# Patient Record
Sex: Female | Born: 1980 | Race: White | Hispanic: No | Marital: Married | Smoking: Never smoker
Health system: Southern US, Community
[De-identification: ages and names within clinical notes are randomized; demographics above are authoritative.]

## PROBLEM LIST (undated history)

## (undated) DIAGNOSIS — T7840XA Allergy, unspecified, initial encounter: Secondary | ICD-10-CM

## (undated) DIAGNOSIS — C801 Malignant (primary) neoplasm, unspecified: Secondary | ICD-10-CM

## (undated) DIAGNOSIS — F329 Major depressive disorder, single episode, unspecified: Secondary | ICD-10-CM

## (undated) DIAGNOSIS — F32A Depression, unspecified: Secondary | ICD-10-CM

## (undated) DIAGNOSIS — F419 Anxiety disorder, unspecified: Secondary | ICD-10-CM

## (undated) HISTORY — DX: Allergy, unspecified, initial encounter: T78.40XA

## (undated) HISTORY — PX: COSMETIC SURGERY: SHX468

## (undated) HISTORY — PX: DILATION AND CURETTAGE OF UTERUS: SHX78

## (undated) HISTORY — PX: BREAST ENHANCEMENT SURGERY: SHX7

## (undated) HISTORY — PX: BREAST SURGERY: SHX581

## (undated) HISTORY — PX: TUBAL LIGATION: SHX77

## (undated) HISTORY — PX: ABDOMINAL HYSTERECTOMY: SHX81

## (undated) HISTORY — DX: Malignant (primary) neoplasm, unspecified: C80.1

---

## 2007-07-10 ENCOUNTER — Emergency Department: Payer: Self-pay | Admitting: Emergency Medicine

## 2008-06-11 ENCOUNTER — Emergency Department: Payer: Self-pay | Admitting: Emergency Medicine

## 2008-06-19 ENCOUNTER — Emergency Department: Payer: Self-pay | Admitting: Emergency Medicine

## 2010-12-13 ENCOUNTER — Inpatient Hospital Stay: Payer: Self-pay

## 2011-08-04 ENCOUNTER — Ambulatory Visit: Payer: Self-pay | Admitting: Obstetrics and Gynecology

## 2013-01-05 ENCOUNTER — Observation Stay: Payer: Self-pay | Admitting: Obstetrics and Gynecology

## 2013-01-28 ENCOUNTER — Inpatient Hospital Stay: Payer: Self-pay | Admitting: Obstetrics and Gynecology

## 2013-01-28 LAB — CBC WITH DIFFERENTIAL/PLATELET
HCT: 37.1 % (ref 35.0–47.0)
HGB: 12.7 g/dL (ref 12.0–16.0)
Lymphocyte %: 11.3 %
MCH: 30.2 pg (ref 26.0–34.0)
MCHC: 34.2 g/dL (ref 32.0–36.0)
MCV: 88 fL (ref 80–100)
Monocyte #: 0.9 x10 3/mm (ref 0.2–0.9)
Platelet: 245 10*3/uL (ref 150–440)
RBC: 4.2 10*6/uL (ref 3.80–5.20)
RDW: 14.9 % — ABNORMAL HIGH (ref 11.5–14.5)
WBC: 18.1 10*3/uL — ABNORMAL HIGH (ref 3.6–11.0)

## 2013-01-29 LAB — HEMATOCRIT: HCT: 27.1 % — ABNORMAL LOW (ref 35.0–47.0)

## 2013-01-30 LAB — PATHOLOGY REPORT

## 2014-10-19 ENCOUNTER — Emergency Department: Payer: Self-pay | Admitting: Emergency Medicine

## 2014-12-12 NOTE — Op Note (Signed)
PATIENT NAME:  Madison [name], Madison [name] MR#:  [id number] DATE OF BIRTH:  1980-11-10  DATE OF PROCEDURE:  01/28/2013  PREOPERATIVE DIAGNOSIS: Prior cesarean section, fetal intolerance to labor.   POSTOPERATIVE DIAGNOSIS: Prior cesarean section, fetal intolerance to labor.   PROCEDURE: Low transverse C-section.   ANESTHESIA: General.  SURGEON: Donzetta Matters, M.D.   ASSISTANT: Dalia Heading, M.D.   ESTIMATED BLOOD LOSS: 800 Ml.   OPERATIVE FLUIDS: 1500 mL.   COMPLICATIONS: None.   FINDINGS: Vertex female infant, nuchal cord x 1, 4240 grams, Apgars 5 and 9. Normal uterus, tubes, and ovaries. Thin lower uterine segment.    SPECIMENS: Placenta and cord gas.   INDICATIONS: The patient is a 34 year old with a history of a prior cesarean section, who presents in labor for attempted vaginal birth after cesarean section. The patient presented at 7 cm. She has fetal tachycardia, repetitive late and repetitive variable decelerations. The decision was therefore made to deliver via repeat cesarean section. Risks, benefits, indications, and alternatives of the procedure were explained and informed consent was obtained.   PROCEDURE: The patient was taken to the operating room with IV fluids running. She was prepped and draped in the usual sterile fashion with a leftward tilt. She was placed under general anesthesia. A Pfannenstiel incision was made and carried down to the underlying fascia with the knife. The fascia was nicked in the midline. The incision was extended laterally. The superior aspect of the fascia was grasped with Kocher clamps and the underlying rectus muscles were dissected off. This was repeated on the inferior fascia. The rectus muscles were divided in the midline. The peritoneum was entered bluntly. The opening was extended. The bladder blade was placed. The vesicouterine peritoneum was grasped with the pick-ups and entered sharply with the Metzenbaum. The bladder flap was created  digitally. The Foley catheter had not been draining properly as the bladder was bulging into the surgical field upon evaluation by the nursing staff. The bladder then began to drain. The bladder blade was then replaced. The hysterotomy incision was made and carried down to underlying fetal parts. The opening was extended. The infant's head was grasped and delivered atraumatically through the hysterotomy incision. Nuchal cord x 1 was reduced. The anterior and posterior shoulders were delivered followed by the remainder of the body. The cord was clamped x 2 and cut and the infant was handed to the awaiting neonatologist.   There were some extensions of the hysterotomy incision down until the cervix and the hysterotomy incision in these areas were repaired with a #0 Monocryl in a running locked fashion. A second imbricating layer was placed. Surgical area was evaluated and found to be hemostatic. The uterus was returned to the abdomen. The abdomen and gutters were irrigated with copious amounts of warm normal saline. The peritoneum was closed with a 2-0 Vicryl. The On-Q pump apparatus was placed according to manufacturer's instructions. The fascia was closed with a #1 PDS. The subcutaneous layer was closed with a 2-0 Vicryl and the skin was closed with staples. The On-Q catheters were each bolused with 5 mL of 0.5% Sensorcaine. The area in the skin through the catheters were placed was sealed using Dermabond skin glue. The catheters were then affixed to the patient's abdomen using Steri-Strips and Tegaderm. The patient tolerated the procedure well. Sponge, needle and instrument counts were correct x 2. The patient was awakened from anesthesia and taken to the recovery room in stable condition.   ____________________________ Rolm Gala Ferne Reus, MD  law:aw D: 01/28/2013 18:40:22 ET T: 01/29/2013 06:07:53 ET JOB#: [id number]  cc: Sherlynn Carbon A. Ferne Reus, MD, <Dictator> Rolm Gala WEAVER LEE MD ELECTRONICALLY  SIGNED 02/01/2013 10:11

## 2014-12-30 NOTE — H&P (Signed)
L&D Evaluation:  History:  HPI 34 yo G5P2022 at [redacted] wks gestational age by EDC=01/28/2013 by LMP=04/23/2012 and confirmed with an 8 weeks ultrasound presents with c/o contractions since 1000 AM. Has had some spotting since membranes stripped last week. No LOF. Pregnancy has been complicated by history of prior cesarean section  with G2 for FITL  and successful ECV 3-4 weeks ago. Patient desires a VBAC and has been counseled on the risks of uterine rupture with TOLAC and has signed the [facility] TOLAC permit.LABS: O POS, RI, VI, GBS positive. Had TDAP on 12/04/12.   Presents with contractions   Patient's Medical History anxiety/depression   Patient's Surgical History D&C  Previous C-Section  breast augmentation   Medications Pre Natal Vitamins   Allergies NKDA   Social History none   Family History Non-Contributory   ROS:  ROS see HPI   Exam:  Vital Signs 119/76   General breathing thru contractions   Mental Status clear   Chest clear   Heart normal sinus rhythm, no murmur/gallop/rubs   Abdomen gravid, tender with contractions   Estimated Fetal Weight 8#   Fetal Position cephalic on US   Pelvic 4/26%/STMHD per RN exam   Mebranes Intact   FHT 170s baseline with variable decels with most contractions   Ucx q1 min aprt   Impression:  Impression IUP at 40 weeks with prior C-section in active labor. Fetal tachycardia   Plan:  Plan Admit. IV bolus. AROM and FSE. LAbs. Dr Ferne Reus notified of VBAC.   Electronic Signatures: Karene Fry (CNM)  (Signed 09-Jun-14 16:26)  Authored: L&D Evaluation   Last Updated: 09-Jun-14 16:26 by Karene Fry (CNM)

## 2014-12-30 NOTE — H&P (Signed)
L&D Evaluation:  History:  HPI 34 yo [id number] at [redacted]w[redacted]d gestational age by LMP consistent with 8 weeks ultrasound, pregnancy has been complicated by history of prior cesarean section  with G2 and malpresentation noted at 36 weeks visit. She notes positive fetal movement, denies leakage of fluid, vaginal bleeding, and contractions.  She presents today for external cephalic version.   Patient's Medical History anxiety/depression   Patient's Surgical History D&C  Previous C-Section  breast augmentation   Medications Pre Natal Vitamins   Allergies NKDA   Social History none   Family History Non-Contributory   ROS:  ROS All systems were reviewed.  HEENT, CNS, GI, GU, Respiratory, CV, Renal and Musculoskeletal systems were found to be normal.   Exam:  Vital Signs stable   General no apparent distress   Mental Status clear   Chest clear   Heart normal sinus rhythm   Abdomen gravid, non-tender   Estimated Fetal Weight Average for gestational age   Fetal Position transverse, head maternal left with spine down (caudad).   Back no CVAT   Edema no edema   Mebranes Intact   FHT normal rate with no decels   FHT Description 135/mod var/+accels/no decels   Ucx irregular, 3 q 10 min   Skin no lesions   Other Bedside ultrasound shows fetus in transverse lie with spine down, AFI = 19cm, placenta is anterior, well away from cervix.   Impression:  Impression malpresentation at [redacted]w[redacted]d gestational age with history of prior cesarean section   Plan:  Plan EFM/NST, fluids   Comments - I have had an exceedingly long and detailed discussion with the patient and her husband regarding the fetal lie.  She was seen by me in clinic 5 days ago.  At that time I performed a bedside ultrasound showing the fetus in breech presentation.  Today the fetus has rotated to the transverse presentation and the fetal head is on the opposite side.  We discussed the following management strategies: 1) do  nothing at this time and see if fetus will turn by himself to vertex.  This was discussed as a very reasonable option as it carried no procedural risks (discussed in detail) and she has a history of a breech presentation where the fetus spontaneously verted to cephalic at 39 weeks with G2.  The main downside to this approach would be that the fetus may not turn to cephalic and may go to breech or stay transverse.  2) Attempt cephalic version today with fairly high chance of success given the following factors that have been shown in some studies to predict success; parity (not her first baby), adequate amniotic fluid, transverse lie, fetus not applied to pelvis.  We discussed that factors not in her favor were that she did have an anterior placenta.  However, this has not been definitively shown to make a large difference.  We discussed the risks of the procedure including; placental abruption, rupture of membranes, fetal intolerance of the procedure, risk of failure of procedure, risk that fetus would return to a non-cephalic presentation even if the procedure is successful.  Any of these risks could necessitate an emergency cesarean section.  We discussed use of medications involved in the procedure (terbutaline) and its side effects and risks.  She and her husband agree to proceed with the procedure pending a reactive NST (which she has already demonstrated).  The OR, anesthesia, and appropriate nursing teams are aware that this procedure is being attempted.  Will minitor, regardless  of the outcome, for at least one hour post-procedure.   Electronic Signatures for Addendum Section:  Will Bonnet (MD) (Signed Addendum 17-May-14 14:16)  After some delay, due to need to have availability of OR staff, patient scanned again to verify still need for attempted external cephalic version.  Fetus in cephalic presentation spontaneously upon recheck.  Fetal monitoring the entire time at hospital was reassuing and  category 1.  Allowed another hour to verify fetus stayed in cephalic presentation.  Given no intervention by me, usual precautions and follow-up recommended.  All questions answered.   Electronic Signatures: Will Bonnet (MD)  (Signed 17-May-14 11:19)  Authored: L&D Evaluation   Last Updated: 17-May-14 14:16 by Will Bonnet (MD)

## 2015-08-23 DIAGNOSIS — C801 Malignant (primary) neoplasm, unspecified: Secondary | ICD-10-CM

## 2015-08-23 HISTORY — DX: Malignant (primary) neoplasm, unspecified: C80.1

## 2015-08-23 HISTORY — PX: ROBOTIC ASSISTED LAPAROSCOPIC HYSTERECTOMY AND SALPINGECTOMY: SHX6379

## 2015-09-21 LAB — OB RESULTS CONSOLE RPR: RPR: NONREACTIVE

## 2015-09-21 LAB — OB RESULTS CONSOLE HIV ANTIBODY (ROUTINE TESTING): HIV: NONREACTIVE

## 2015-10-01 LAB — OB RESULTS CONSOLE VARICELLA ZOSTER ANTIBODY, IGG: VARICELLA IGG: NON-IMMUNE/NOT IMMUNE

## 2015-10-01 LAB — OB RESULTS CONSOLE RUBELLA ANTIBODY, IGM: Rubella: NON-IMMUNE/NOT IMMUNE

## 2015-10-01 LAB — OB RESULTS CONSOLE HEPATITIS B SURFACE ANTIGEN: HEP B S AG: NEGATIVE

## 2015-12-07 ENCOUNTER — Encounter
Admission: RE | Admit: 2015-12-07 | Discharge: 2015-12-07 | Disposition: A | Discharge status: Home / Self Care | Payer: BLUE CROSS/BLUE SHIELD | Source: Ambulatory Visit | Attending: Obstetrics and Gynecology | Admitting: Obstetrics and Gynecology

## 2015-12-07 HISTORY — DX: Depression, unspecified: F32.A

## 2015-12-07 HISTORY — DX: Major depressive disorder, single episode, unspecified: F32.9

## 2015-12-07 HISTORY — DX: Anxiety disorder, unspecified: F41.9

## 2015-12-07 LAB — TYPE AND SCREEN
ABO/RH(D): O POS
Antibody Screen: NEGATIVE
Extend sample reason: UNDETERMINED

## 2015-12-07 LAB — RAPID HIV SCREEN (HIV 1/2 AB+AG)
HIV 1/2 ANTIBODIES: NONREACTIVE
HIV-1 P24 ANTIGEN - HIV24: NONREACTIVE

## 2015-12-07 LAB — CBC
HCT: 36 % (ref 35.0–47.0)
Hemoglobin: 12.5 g/dL (ref 12.0–16.0)
MCH: 30.9 pg (ref 26.0–34.0)
MCHC: 34.6 g/dL (ref 32.0–36.0)
MCV: 89.2 fL (ref 80.0–100.0)
PLATELETS: 276 10*3/uL (ref 150–440)
RBC: 4.04 MIL/uL (ref 3.80–5.20)
RDW: 14.8 % — AB (ref 11.5–14.5)
WBC: 9.1 10*3/uL (ref 3.6–11.0)

## 2015-12-07 LAB — ABO/RH: ABO/RH(D): O POS

## 2015-12-07 LAB — OB RESULTS CONSOLE GBS: GBS: POSITIVE

## 2015-12-07 NOTE — Patient Instructions (Addendum)
  Your procedure is scheduled on: Tuesday 12/08/15 Report to [place] EMERGENCY DEPT AT 5:30 AM. .  Remember: Instructions that are not followed completely may result in serious medical risk, up to and including death, or upon the discretion of your surgeon and anesthesiologist your surgery may need to be rescheduled.    __X__ 1. Do not eat food or drink liquids after midnight. No gum chewing or hard candies.     __X__ 2. No Alcohol for 24 hours before or after surgery.   ____ 3. Bring all medications with you on the day of surgery if instructed.    __X__ 4. Notify your doctor if there is any change in your medical condition     (cold, fever, infections).     Do not wear jewelry, make-up, hairpins, clips or nail polish.  Do not wear lotions, powders, or perfumes. You may wear deodorant.  Do not shave 48 hours prior to surgery. Men may shave face and neck.  Do not bring valuables to the hospital.    [facility] is not responsible for any belongings or valuables.               Contacts, dentures or bridgework may not be worn into surgery.  Leave your suitcase in the car. After surgery it may be brought to your room.  For patients admitted to the hospital, discharge time is determined by your                treatment team.   Patients discharged the day of surgery will not be allowed to drive home.   Please read over the following fact sheets that you were given:   Surgical Site Infection Prevention   ____ Take these medicines the morning of surgery with A SIP OF WATER:    1. NONE  2.   3.   4.  5.  6.  ____ Fleet Enema (as directed)   __X__ Use CHG Soap as directed (SAGE WIPES)  ____ Use inhalers on the day of surgery  ____ Stop metformin 2 days prior to surgery    ____ Take 1/2 of usual insulin dose the night before surgery and none on the morning of surgery.   ____ Stop Coumadin/Plavix/aspirin on   ____ Stop Anti-inflammatories on    ____ Stop supplements  until after surgery.    ____ Bring C-Pap to the hospital.

## 2015-12-08 ENCOUNTER — Inpatient Hospital Stay
Admission: RE | Admit: 2015-12-08 | Discharge: 2015-12-10 | DRG: 765 | Disposition: A | Discharge status: Home / Self Care | Payer: BLUE CROSS/BLUE SHIELD | Source: Ambulatory Visit | Attending: Obstetrics and Gynecology | Admitting: Obstetrics and Gynecology

## 2015-12-08 ENCOUNTER — Inpatient Hospital Stay: Payer: BLUE CROSS/BLUE SHIELD | Admitting: Certified Registered Nurse Anesthetist

## 2015-12-08 ENCOUNTER — Encounter
Admission: RE | Disposition: A | Discharge status: Home / Self Care | Payer: Self-pay | Source: Ambulatory Visit | Attending: Obstetrics and Gynecology

## 2015-12-08 DIAGNOSIS — O9081 Anemia of the puerperium: Secondary | ICD-10-CM | POA: Diagnosis present

## 2015-12-08 DIAGNOSIS — Z3A39 39 weeks gestation of pregnancy: Secondary | ICD-10-CM | POA: Diagnosis not present

## 2015-12-08 DIAGNOSIS — D62 Acute posthemorrhagic anemia: Secondary | ICD-10-CM | POA: Diagnosis present

## 2015-12-08 DIAGNOSIS — Z302 Encounter for sterilization: Secondary | ICD-10-CM

## 2015-12-08 DIAGNOSIS — Z98891 History of uterine scar from previous surgery: Secondary | ICD-10-CM

## 2015-12-08 DIAGNOSIS — O99820 Streptococcus B carrier state complicating pregnancy: Secondary | ICD-10-CM | POA: Diagnosis present

## 2015-12-08 DIAGNOSIS — O34211 Maternal care for low transverse scar from previous cesarean delivery: Secondary | ICD-10-CM | POA: Diagnosis present

## 2015-12-08 LAB — RPR: RPR Ser Ql: NONREACTIVE

## 2015-12-08 SURGERY — Surgical Case
Anesthesia: Spinal

## 2015-12-08 MED ORDER — MORPHINE SULFATE (PF) 2 MG/ML IV SOLN: 1.0000 mg | INTRAVENOUS | Status: DC | PRN

## 2015-12-08 MED ORDER — DIBUCAINE 1 % RE OINT: 1.0000 "application " | TOPICAL_OINTMENT | RECTAL | Status: DC | PRN

## 2015-12-08 MED ORDER — ONDANSETRON HCL 4 MG/2ML IJ SOLN: 4.0000 mg | Freq: Once | INTRAMUSCULAR | Status: DC | PRN

## 2015-12-08 MED ORDER — SENNOSIDES-DOCUSATE SODIUM 8.6-50 MG PO TABS
2.0000 | ORAL_TABLET | ORAL | Status: DC
  Administered 2015-12-09: 2 via ORAL
  Filled 2015-12-08: qty 2

## 2015-12-08 MED ORDER — LACTATED RINGERS IV SOLN
INTRAVENOUS | Status: DC
Start: 2015-12-08 — End: 2015-12-10
  Administered 2015-12-09: 05:00:00 via INTRAVENOUS

## 2015-12-08 MED ORDER — MENTHOL 3 MG MT LOZG
1.0000 | LOZENGE | OROMUCOSAL | Status: DC | PRN
  Filled 2015-12-08: qty 9

## 2015-12-08 MED ORDER — ONDANSETRON HCL 4 MG/2ML IJ SOLN: 4.0000 mg | Freq: Three times a day (TID) | INTRAMUSCULAR | Status: DC | PRN

## 2015-12-08 MED ORDER — FERROUS SULFATE 325 (65 FE) MG PO TABS
325.0000 mg | ORAL_TABLET | Freq: Two times a day (BID) | ORAL | Status: DC
  Administered 2015-12-08 – 2015-12-09 (×2): 325 mg via ORAL
  Filled 2015-12-08 (×2): qty 1

## 2015-12-08 MED ORDER — NALOXONE HCL 0.4 MG/ML IJ SOLN: 0.4000 mg | INTRAMUSCULAR | Status: DC | PRN

## 2015-12-08 MED ORDER — LACTATED RINGERS IV SOLN
INTRAVENOUS | Status: DC
  Administered 2015-12-08: 07:00:00 via INTRAVENOUS

## 2015-12-08 MED ORDER — OXYTOCIN 40 UNITS IN LACTATED RINGERS INFUSION - SIMPLE MED
INTRAVENOUS | Status: AC
  Filled 2015-12-08: qty 1000

## 2015-12-08 MED ORDER — DIPHENHYDRAMINE HCL 25 MG PO CAPS: 25.0000 mg | ORAL_CAPSULE | Freq: Four times a day (QID) | ORAL | Status: DC | PRN

## 2015-12-08 MED ORDER — CITRIC ACID-SODIUM CITRATE 334-500 MG/5ML PO SOLN
30.0000 mL | ORAL | Status: AC
  Administered 2015-12-08: 30 mL via ORAL
  Filled 2015-12-08: qty 15

## 2015-12-08 MED ORDER — SIMETHICONE 80 MG PO CHEW
80.0000 mg | CHEWABLE_TABLET | Freq: Three times a day (TID) | ORAL | Status: DC
  Administered 2015-12-08 – 2015-12-09 (×4): 80 mg via ORAL
  Filled 2015-12-08 (×5): qty 1

## 2015-12-08 MED ORDER — PHENYLEPHRINE HCL 10 MG/ML IJ SOLN
INTRAMUSCULAR | Status: DC | PRN
  Administered 2015-12-08 (×3): 100 ug via INTRAVENOUS

## 2015-12-08 MED ORDER — CEFAZOLIN SODIUM-DEXTROSE 2-4 GM/100ML-% IV SOLN
2.0000 g | INTRAVENOUS | Status: AC
  Administered 2015-12-08: 2 g via INTRAVENOUS
  Filled 2015-12-08: qty 100

## 2015-12-08 MED ORDER — SCOPOLAMINE 1 MG/3DAYS TD PT72
1.0000 | MEDICATED_PATCH | Freq: Once | TRANSDERMAL | Status: DC
  Administered 2015-12-08: 1.5 mg via TRANSDERMAL
  Filled 2015-12-08: qty 1

## 2015-12-08 MED ORDER — NALBUPHINE HCL 10 MG/ML IJ SOLN: 5.0000 mg | Freq: Once | INTRAMUSCULAR | Status: DC | PRN

## 2015-12-08 MED ORDER — OXYCODONE-ACETAMINOPHEN 5-325 MG PO TABS: 2.0000 | ORAL_TABLET | ORAL | Status: DC | PRN

## 2015-12-08 MED ORDER — DIPHENHYDRAMINE HCL 50 MG/ML IJ SOLN: 12.5000 mg | INTRAMUSCULAR | Status: DC | PRN

## 2015-12-08 MED ORDER — DEXTROSE 5 % IV SOLN
1.0000 ug/kg/h | INTRAVENOUS | Status: DC | PRN
  Filled 2015-12-08: qty 2

## 2015-12-08 MED ORDER — COCONUT OIL OIL
1.0000 "application " | TOPICAL_OIL | Status: DC | PRN
  Filled 2015-12-08: qty 120

## 2015-12-08 MED ORDER — PRENATAL MULTIVITAMIN CH
1.0000 | ORAL_TABLET | Freq: Every day | ORAL | Status: DC
  Administered 2015-12-09: 1 via ORAL
  Filled 2015-12-08: qty 1

## 2015-12-08 MED ORDER — SODIUM CHLORIDE 0.9% FLUSH: 3.0000 mL | INTRAVENOUS | Status: DC | PRN

## 2015-12-08 MED ORDER — OXYTOCIN 10 UNIT/ML IJ SOLN
2.5000 [IU]/h | INTRAVENOUS | Status: AC
  Filled 2015-12-08: qty 4

## 2015-12-08 MED ORDER — FENTANYL CITRATE (PF) 100 MCG/2ML IJ SOLN: 25.0000 ug | INTRAMUSCULAR | Status: DC | PRN

## 2015-12-08 MED ORDER — IBUPROFEN 600 MG PO TABS: 600.0000 mg | ORAL_TABLET | Freq: Four times a day (QID) | ORAL | Status: DC

## 2015-12-08 MED ORDER — WITCH HAZEL-GLYCERIN EX PADS: 1.0000 "application " | MEDICATED_PAD | CUTANEOUS | Status: DC | PRN

## 2015-12-08 MED ORDER — DIPHENHYDRAMINE HCL 25 MG PO CAPS: 25.0000 mg | ORAL_CAPSULE | ORAL | Status: DC | PRN

## 2015-12-08 MED ORDER — OXYCODONE-ACETAMINOPHEN 5-325 MG PO TABS: 1.0000 | ORAL_TABLET | ORAL | Status: DC | PRN

## 2015-12-08 MED ORDER — NALBUPHINE HCL 10 MG/ML IJ SOLN: 5.0000 mg | INTRAMUSCULAR | Status: DC | PRN

## 2015-12-08 MED ORDER — IBUPROFEN 600 MG PO TABS
600.0000 mg | ORAL_TABLET | Freq: Four times a day (QID) | ORAL | Status: DC | PRN
  Administered 2015-12-08 – 2015-12-09 (×3): 600 mg via ORAL
  Filled 2015-12-08 (×3): qty 1

## 2015-12-08 MED ORDER — OXYTOCIN 40 UNITS IN LACTATED RINGERS INFUSION - SIMPLE MED
INTRAVENOUS | Status: AC
  Administered 2015-12-08: 200 mL via INTRAVENOUS
  Administered 2015-12-08: 100 mL via INTRAVENOUS
  Administered 2015-12-08 (×2): 200 mL via INTRAVENOUS
  Filled 2015-12-08: qty 1000

## 2015-12-08 MED ORDER — ONDANSETRON HCL 4 MG/2ML IJ SOLN
INTRAMUSCULAR | Status: DC | PRN
  Administered 2015-12-08: 4 mg via INTRAVENOUS

## 2015-12-08 MED ORDER — MEPERIDINE HCL 25 MG/ML IJ SOLN: 6.2500 mg | INTRAMUSCULAR | Status: DC | PRN

## 2015-12-08 MED ORDER — LACTATED RINGERS IV SOLN
INTRAVENOUS | Status: DC
  Administered 2015-12-08: 1000 mL via INTRAVENOUS

## 2015-12-08 SURGICAL SUPPLY — 29 items
CANISTER SUCT 3000ML (MISCELLANEOUS) ×4 IMPLANT
CATH KIT ON-Q SILVERSOAK 5IN (CATHETERS) IMPLANT
CLOSURE WOUND 1/2 X4 (GAUZE/BANDAGES/DRESSINGS)
DRSG TELFA 3X8 NADH (GAUZE/BANDAGES/DRESSINGS) IMPLANT
ELECT CAUTERY BLADE 6.4 (BLADE) ×4 IMPLANT
ELECT REM PT RETURN 9FT ADLT (ELECTROSURGICAL) ×4
ELECTRODE REM PT RTRN 9FT ADLT (ELECTROSURGICAL) ×2 IMPLANT
GAUZE SPONGE 4X4 12PLY STRL (GAUZE/BANDAGES/DRESSINGS) IMPLANT
GLOVE BIO SURGEON STRL SZ7 (GLOVE) ×20 IMPLANT
GLOVE INDICATOR 7.5 STRL GRN (GLOVE) ×16 IMPLANT
GOWN STRL REUS W/ TWL LRG LVL3 (GOWN DISPOSABLE) ×6 IMPLANT
GOWN STRL REUS W/TWL LRG LVL3 (GOWN DISPOSABLE) ×6
LIQUID BAND (GAUZE/BANDAGES/DRESSINGS) ×4 IMPLANT
NS IRRIG 1000ML POUR BTL (IV SOLUTION) ×4 IMPLANT
PACK C SECTION AR (MISCELLANEOUS) ×4 IMPLANT
PAD OB MATERNITY 4.3X12.25 (PERSONAL CARE ITEMS) ×8 IMPLANT
PAD PREP 24X41 OB/GYN DISP (PERSONAL CARE ITEMS) ×4 IMPLANT
SPONGE LAP 18X18 5 PK (GAUZE/BANDAGES/DRESSINGS) IMPLANT
STRIP CLOSURE SKIN 1/2X4 (GAUZE/BANDAGES/DRESSINGS) IMPLANT
SUCT VACUUM KIWI BELL (SUCTIONS) ×4 IMPLANT
SUT CHROMIC GUT BROWN 0 54 (SUTURE) IMPLANT
SUT CHROMIC GUT BROWN 0 54IN (SUTURE)
SUT MNCRL 4-0 (SUTURE) ×2
SUT MNCRL 4-0 27XMFL (SUTURE) ×2
SUT PDS AB 1 TP1 96 (SUTURE) ×4 IMPLANT
SUT PLAIN 2 0 XLH (SUTURE) ×12 IMPLANT
SUT VIC AB 0 CT1 36 (SUTURE) ×16 IMPLANT
SUTURE MNCRL 4-0 27XMF (SUTURE) ×2 IMPLANT
SWABSTK COMLB BENZOIN TINCTURE (MISCELLANEOUS) IMPLANT

## 2015-12-08 NOTE — Anesthesia Preprocedure Evaluation (Addendum)
Anesthesia Evaluation  Patient identified by MRN, date of birth, ID band Patient awake    Reviewed: Allergy & Precautions, NPO status , Patient's Chart, lab work & pertinent test results  Airway Mallampati: II  TM Distance: >3 FB Neck ROM: Full    Dental no notable dental hx.    Pulmonary neg pulmonary ROS,    Pulmonary exam normal        Cardiovascular negative cardio ROS Normal cardiovascular exam     Neuro/Psych negative neurological ROS     GI/Hepatic negative GI ROS, Neg liver ROS,   Endo/Other  negative endocrine ROS  Renal/GU negative Renal ROS  negative genitourinary   Musculoskeletal negative musculoskeletal ROS (+)   Abdominal (+) + obese,   Peds negative pediatric ROS (+)  Hematology   Anesthesia Other Findings   Reproductive/Obstetrics (+) Pregnancy                             Anesthesia Physical Anesthesia Plan  ASA: II  Anesthesia Plan: Spinal   Post-op Pain Management:    Induction:   Airway Management Planned: Nasal Cannula  Additional Equipment:   Intra-op Plan:   Post-operative Plan:   Informed Consent: I have reviewed the patients History and Physical, chart, labs and discussed the procedure including the risks, benefits and alternatives for the proposed anesthesia with the patient or authorized representative who has indicated his/her understanding and acceptance.   Dental advisory given  Plan Discussed with: CRNA and Surgeon  Anesthesia Plan Comments:         Anesthesia Quick Evaluation

## 2015-12-08 NOTE — Discharge Summary (Signed)
OB Discharge Summary  Patient Name: Madison [name] DOB: 1981-04-14 MRN: [id number]  Date of admission: 12/08/2015 Delivering MD: Prentice Docker, MD Date of Delivery: 12/08/2015  Date of discharge: 12/10/2015  Admitting diagnosis: prior cesarean Intrauterine pregnancy: [redacted]w[redacted]d     Secondary diagnosis: None     Discharge diagnosis: Term Pregnancy Delivered                                                                                                Post partum procedures:None  Augmentation: none  Complications: None  Hospital course:  Sceduled C/S   35 y.o. yo [id number] at [redacted]w[redacted]d was admitted to the hospital 12/08/2015 for scheduled cesarean section with the following indication:Elective Repeat and desires permanent sterility.     Patient delivered a Viable infant.12/08/2015  Details of operation can be found in separate operative note.  Pateint had an uncomplicated postpartum course.  She is ambulating, tolerating a regular diet, passing flatus, and urinating well. Patient is discharged home in stable condition on  12/10/2015          Physical exam  Filed Vitals:   12/09/15 1949 12/09/15 2350 12/10/15 0520 12/10/15 0814  BP: 107/68   106/70  Pulse: 76   63  Temp: 98 F (36.7 C) 98.7 F (37.1 C) 98 F (36.7 C) 97.7 F (36.5 C)  TempSrc: Oral Oral Oral Oral  Resp: 19   20  SpO2: 99%      General: alert Lochia: appropriate Uterine Fundus: firm Incision: Healing well with no significant drainage, No significant erythema, Dressing is clean, dry, and intact DVT Evaluation: No evidence of DVT seen on physical exam. No cords or calf tenderness. No significant calf/ankle edema.  Labs: Lab Results  Component Value Date   WBC 10.2 12/09/2015   HGB 11.1* 12/09/2015   HCT 31.9* 12/09/2015   MCV 90.3 12/09/2015   PLT 226 12/09/2015   No flowsheet data found.  Discharge instruction: per After Visit Summary.  Medications:    Medication List    TAKE these medications         PRENATAL VITAMIN PO  Take 1 tablet by mouth daily.        Diet: routine diet  Activity: Advance as tolerated. Pelvic rest for 6 weeks.   Outpatient follow up: Follow up in 1 week for incision check with Dr Glennon Mac Follow up in 6 weeks for postpartum visit with Dr Glennon Mac  Postpartum contraception: Tubal Ligation Rhogam Given postpartum: no Rubella vaccine given postpartum: declines Varicella vaccine given postpartum: declines TDaP given antepartum or postpartum: TDaP given 10/05/15 Flu vaccine given antepartum or postpartum: No  Newborn Data: Live born female  Birth Weight: 9 lb 3.8 oz (4190 g) APGAR: 7, 8  Baby Feeding: Breast  Disposition:home with mother   SIGNED: Rod Can, CNM  This patient and plan were discussed with Dr Kenton Kingfisher 12/10/2015

## 2015-12-08 NOTE — H&P (Signed)
History and Physical Interval Note:  Madison [name]  has presented today for surgery, with the diagnosis of prior cesarean, desires permanent sterility.  The various methods of treatment have been discussed with the patient and family. After consideration of risks, benefits and other options for treatment, the patient has consented to  Procedure(s): CESAREAN SECTION (N/A) and bilateral tubal ligation as a surgical intervention .  The patient's history has been reviewed, patient examined, no change in status, stable for surgery.  I have reviewed the patient's chart and labs.  Questions were answered to the patient's satisfaction.    Will Bonnet, MD 12/08/2015 7:27 AM

## 2015-12-08 NOTE — Op Note (Signed)
Cesarean Section Procedure Note   Madison [name]   12/08/2015   Pre-operative Diagnosis:  1) intrauterine pregnancy @ [redacted]w[redacted]d  2) prior cesarean 3) desires permanent sterility.   Post-operative Diagnosis:  1) intrauterine pregnancy @ [redacted]w[redacted]d  2) prior cesarean 3) desires permanent sterility.   Procedure:  1) Repeat cesarean section via Pfannenstiel incision with double-layer uterine closure 2) bilateral tubal ligation using Pomeroy method  Surgeon: Surgeon(s) and Role:    * Will Bonnet, MD - Primary    * Chelsea C Ward, MD - Assisting   Anesthesia: spinal   Findings:  1) normal appearing gravid uterus, fallopian tubes, and ovaries 2) viable female infant   Estimated Blood Loss: 500 mL  Total IV Fluids: 1,500 ml   Urine Output: 125 mL clear urine at end of procedure  Specimens: portion of right and left fallopian tubes  Complications: no complications  Disposition: PACU - hemodynamically stable.   Maternal Condition: stable   Baby condition / location:  Couplet care / Skin to Skin  Procedure Details:  The patient was seen in the Holding Room. The risks, benefits, complications, treatment options, and expected outcomes were discussed with the patient. The patient concurred with the proposed plan, giving informed consent. identified as Madison [name] and the procedure verified as C-Section Delivery. A Time Out was held and the above information confirmed.   After induction of anesthesia, the patient was draped and prepped in the usual sterile manner. A Pfannenstiel incision was made and carried down through the subcutaneous tissue to the fascia. Fascial incision was made and extended transversely. The fascia was separated from the underlying rectus tissue superiorly and inferiorly. The peritoneum was identified and entered. Peritoneal incision was extended longitudinally. The bladder flap was bluntly freed from the lower uterine segment. A low transverse uterine  incision was made and the hysterotomy was extended with cranial-caudal tension. Delivered from cephalic presentation was a 4,190 gram Living newborn infant(s) or Female with Apgar scores of 7 at one minute and 8 at five minutes. Cord ph was not sent the umbilical cord was clamped and cut cord blood was obtained for evaluation. The placenta was removed Intact and appeared normal. The uterine outline, tubes and ovaries appeared normal. The uterine incision was closed with running locked sutures of 0 Vicryl.  A second layer of the same suture was thrown in an imbricating fashion.  Hemostasis was assured.    The tubal ligation portion of the procedure was performed at this point.  The left fallopian tube was identified and followed out to the fimbriated end.  A Babcock clamp was used to grasp the tube in the mid-isthmic portion and two 2-0 plain gut sutures were used to ligate the tube.  An approximately 3cm segment of tube was removed with hemostasis assured.  The same procedure was performed on the right fallopian tube with hemostasis noted.    The uterus was returned to the abdomen and the paracolic gutters were cleared of all clots and debris.  The rectus muscles were inspected and found to be hemostatic.  The fascia was then reapproximated with running sutures of 1-0 PDS, looped. The subcutaneous tissue was reapproximated using 2-0 plain gut such that no greater than 2cm of dead space remained. The subcuticular closure was performed using 4-0 monocryl. The skin closure was reinforced using surgical skin glue.  Instrument, sponge, and needle counts were correct prior the abdominal closure and were correct at the conclusion of the case.  The  patient received Ancef 2 gram IV prior to skin incision (within 30 minutes). For VTE prophylaxis she was wearing SCDs throughout the case.   Signed: Will Bonnet, MD 12/08/2015 9:17 AM

## 2015-12-08 NOTE — Anesthesia Procedure Notes (Addendum)
Date/Time: 12/08/2015 7:55 AM Performed by: Johnna Acosta Pre-anesthesia Checklist: Patient identified, Emergency Drugs available, Suction available, Patient being monitored and Timeout performed Patient Re-evaluated:Patient Re-evaluated prior to inductionOxygen Delivery Method: Nasal cannula   Spinal Patient location during procedure: OR Start time: 12/08/2015 7:41 AM End time: 12/08/2015 7:50 AM Staffing Anesthesiologist: Alvin Critchley Resident/CRNA: Johnna Acosta Performed by: resident/CRNA  Preanesthetic Checklist Completed: patient identified, site marked, surgical consent, pre-op evaluation, timeout performed, IV checked, risks and benefits discussed and monitors and equipment checked Spinal Block Patient position: sitting Prep: Betadine Patient monitoring: heart rate, continuous pulse ox, blood pressure and cardiac monitor Approach: midline Location: L4-5 Injection technique: single-shot Needle Needle type: Whitacre and Introducer  Needle gauge: 24 G Needle length: 9 cm Additional Notes Negative paresthesia. Negative blood return. Positive free-flowing CSF. Expiration date of kit checked and confirmed. Patient tolerated procedure well, without complications.

## 2015-12-08 NOTE — Transfer of Care (Signed)
Immediate Anesthesia Transfer of Care Note  Patient: Madison [name]  Procedure(s) Performed: Procedure(s): CESAREAN SECTION WITH BILATERAL TUBAL LIGATION (Bilateral)  Patient Location: PACU  Anesthesia Type:Spinal  Level of Consciousness: awake, alert  and oriented  Airway & Oxygen Therapy: Patient Spontanous Breathing and Patient connected to nasal cannula oxygen  Post-op Assessment: Report given to RN and Post -op Vital signs reviewed and stable  Post vital signs: Reviewed and stable  Last Vitals:  Filed Vitals:   12/08/15 0547 12/08/15 0925  BP: 113/78 122/75  Pulse: 106 67  Temp: 36.6 C 35.8 C  Resp: 16 12    Complications: No apparent anesthesia complications

## 2015-12-09 LAB — CBC
HCT: 31.9 % — ABNORMAL LOW (ref 35.0–47.0)
HEMOGLOBIN: 11.1 g/dL — AB (ref 12.0–16.0)
MCH: 31.4 pg (ref 26.0–34.0)
MCHC: 34.8 g/dL (ref 32.0–36.0)
MCV: 90.3 fL (ref 80.0–100.0)
PLATELETS: 226 10*3/uL (ref 150–440)
RBC: 3.53 MIL/uL — ABNORMAL LOW (ref 3.80–5.20)
RDW: 14.9 % — AB (ref 11.5–14.5)
WBC: 10.2 10*3/uL (ref 3.6–11.0)

## 2015-12-09 LAB — SURGICAL PATHOLOGY

## 2015-12-09 MED ORDER — IBUPROFEN 600 MG PO TABS
600.0000 mg | ORAL_TABLET | Freq: Four times a day (QID) | ORAL | Status: DC
  Administered 2015-12-09 – 2015-12-10 (×4): 600 mg via ORAL
  Filled 2015-12-09 (×4): qty 1

## 2015-12-09 NOTE — Anesthesia Postprocedure Evaluation (Signed)
Anesthesia Post Note  Patient: Madison [name]  Procedure(s) Performed: Procedure(s) (LRB): CESAREAN SECTION WITH BILATERAL TUBAL LIGATION (Bilateral)  Patient location during evaluation: Mother Baby Anesthesia Type: Spinal Level of consciousness: oriented and awake and alert Pain management: pain level controlled Vital Signs Assessment: post-procedure vital signs reviewed and stable Respiratory status: spontaneous breathing, respiratory function stable and patient connected to nasal cannula oxygen Cardiovascular status: blood pressure returned to baseline and stable Postop Assessment: no headache and no backache Anesthetic complications: no    Last Vitals:  Filed Vitals:   12/08/15 2354 12/09/15 0435  BP: 93/46 101/63  Pulse: 75 66  Temp: 36.7 C 36.3 C  Resp: 18 18    Last Pain:  Filed Vitals:   12/09/15 0435  PainSc: 2                  Alison Stalling

## 2015-12-09 NOTE — Progress Notes (Signed)
POD #1 repeat CS and TL Subjective:   Doing well. Has voided twice since foley removed. Tolerating regular diet. Working with baby to breast feed-baby is sleepy  Objective:  Blood pressure 101/61, pulse 69, temperature 97.6 F (36.4 C), temperature source Oral, resp. rate 18, last menstrual period 03/16/2015, SpO2 98 %, unknown if currently breastfeeding.  General: NAD, alert, pleasant Pulmonary: no increased work of breathing, CTA Abdomen: non-distended, non-tender, fundus firm at level of umbilicus, BS active Incision: C+D+I Lochia : appropriate Extremities: no edema, no erythema, no tenderness  Results for orders placed or performed during the hospital encounter of 12/08/15 (from the past 72 hour(s))  OB RESULT CONSOLE Group B Strep     Status: None   Collection Time: 12/07/15 12:00 AM  Result Value Ref Range   GBS Positive   CBC     Status: Abnormal   Collection Time: 12/09/15  6:44 AM  Result Value Ref Range   WBC 10.2 3.6 - 11.0 K/uL   RBC 3.53 (L) 3.80 - 5.20 MIL/uL   Hemoglobin 11.1 (L) 12.0 - 16.0 g/dL   HCT 31.9 (L) 35.0 - 47.0 %   MCV 90.3 80.0 - 100.0 fL   MCH 31.4 26.0 - 34.0 pg   MCHC 34.8 32.0 - 36.0 g/dL   RDW 14.9 (H) 11.5 - 14.5 %   Platelets 226 150 - 440 K/uL     Assessment:   35 y.o. [id number] postoperativeday # 1-stable  Return of normal bowel and bladder function   Plan:  1)Mild anemia from acute blood loss  - hemodynamically stable and asymptomatic - vitamins with iron  2) --/--/O POS (04/17 1147) Charlynn Grimes Nonimmune (02/09 0000) / Varicella nonimmune-offer vaccinations prior to discharge  3) TDAP UTD  4) Breast and bottle  5.) Contraception: BTL  5) Disposition-probable discharge in [place], Jaclyn Shaggy, CNM

## 2015-12-10 NOTE — Discharge Instructions (Signed)
Incision Care: Keep incision area clean, dry and open to air. Shower daily to prevent infection. Only pat incisions, no rubbing or circling the incision area. Use mild soap and warm water to clean incisions. Make sure to dry area completely.  Monitor incision area for redness, severe pain, drainage, or odor, if so contact your physician.  No heavy lifting until cleared by physician.  Take pain medications to manage pain, if pain is increased or unrelieved by medications contact your physician.   Make sure to stay active, ambulate often.  Call your physician if you develop a temperature greater than 100.4, experience any chest pain, shortness of breath.  Make sure to follow up with physician within specified time.  Bleeding: Your bleeding could continue up to 6 weeks, the flow should gradually decrease and the color should become dark then lightened over the next couple of weeks. If you notice you are bleeding heavily or passing clots larger than the size of your fist, PLEASE call your physician. No TAMPONS, DOUCHING, ENEMAS OR SEXUAL INTERCOURSE for 6 weeks.   Stitches: Shower daily with mild soap and water. Stitches will dissolve over the next couple of weeks, if you experience any discomfort in the vaginal area you may sit in warm water 15-20 minutes, 3-4 times per day. Just enough water to cover vaginal area.   AfterPains: This is the uterus contracting back to its normal position and size. Use medications prescribed or recommended by your physician to help relieve this discomfort.   Bowels/Hemorrhoids: Drink plenty of water and stay active. Increase fiber, fresh fruits and vegetables in your diet.   Rest/Activity: Rest when the baby is resting  Bathing: Shower daily!  Diet: Continue to eat extra calories until your follow up visit to help replenish nutrients and vitamins. If breastfeeding eat an extra [id number] and increase your fluid intake to 12 glasses a day.   Contraception: Consult  with your physician on what method of birth control you would like to use.   Postpartum "BLUES": It is common to emotional days after delivery, however if it persist for greater than 2 weeks or if you feel concerned please let your physician know immediately. This is hormone driven and nothing you can control so please let someone know how you feel.  Follow Up Visit: Please schedule a follow up visit with your physician.

## 2015-12-10 NOTE — Progress Notes (Signed)
Patient understands all discharge instructions and the need to make follow up appointments. Patient discharge via wheelchair with auxillary. 

## 2016-04-18 ENCOUNTER — Encounter
Admission: RE | Admit: 2016-04-18 | Discharge: 2016-04-18 | Disposition: A | Discharge status: Home / Self Care | Payer: BLUE CROSS/BLUE SHIELD | Source: Ambulatory Visit | Attending: Obstetrics and Gynecology | Admitting: Obstetrics and Gynecology

## 2016-04-18 DIAGNOSIS — Z01812 Encounter for preprocedural laboratory examination: Secondary | ICD-10-CM | POA: Insufficient documentation

## 2016-04-18 LAB — COMPREHENSIVE METABOLIC PANEL
ALBUMIN: 4.3 g/dL (ref 3.5–5.0)
ALT: 34 U/L (ref 14–54)
ANION GAP: 5 (ref 5–15)
AST: 29 U/L (ref 15–41)
Alkaline Phosphatase: 112 U/L (ref 38–126)
BILIRUBIN TOTAL: 0.4 mg/dL (ref 0.3–1.2)
BUN: 10 mg/dL (ref 6–20)
CO2: 28 mmol/L (ref 22–32)
Calcium: 9.3 mg/dL (ref 8.9–10.3)
Chloride: 106 mmol/L (ref 101–111)
Creatinine, Ser: 0.68 mg/dL (ref 0.44–1.00)
GFR calc Af Amer: >60 mL/min (ref >60)
GFR calc non Af Amer: >60 mL/min (ref >60)
GLUCOSE: 98 mg/dL (ref 65–99)
POTASSIUM: 4 mmol/L (ref 3.5–5.1)
Sodium: 139 mmol/L (ref 135–145)
TOTAL PROTEIN: 7.6 g/dL (ref 6.5–8.1)

## 2016-04-18 LAB — TYPE AND SCREEN
ABO/RH(D): O POS
Antibody Screen: NEGATIVE

## 2016-04-18 LAB — CBC
HEMATOCRIT: 42.7 % (ref 35.0–47.0)
Hemoglobin: 15 g/dL (ref 12.0–16.0)
MCH: 29.4 pg (ref 26.0–34.0)
MCHC: 35 g/dL (ref 32.0–36.0)
MCV: 84 fL (ref 80.0–100.0)
PLATELETS: 382 10*3/uL (ref 150–440)
RBC: 5.08 MIL/uL (ref 3.80–5.20)
RDW: 13.5 % (ref 11.5–14.5)
WBC: 10.6 10*3/uL (ref 3.6–11.0)

## 2016-04-18 NOTE — Patient Instructions (Signed)
Your procedure is scheduled on: Thursday 04/21/16 Report to Day Surgery. 2ND FLOOR MEDICAL MALL ENTRANCE To find out your arrival time please call [phone] between 1PM - 3PM on Wednesday 04/20/16.  Remember: Instructions that are not followed completely may result in serious medical risk, up to and including death, or upon the discretion of your surgeon and anesthesiologist your surgery may need to be rescheduled.    __X__ 1. Do not eat food or drink liquids after midnight. No gum chewing or hard candies.     __X__ 2. No Alcohol for 24 hours before or after surgery.   ____ 3. Bring all medications with you on the day of surgery if instructed.    __X__ 4. Notify your doctor if there is any change in your medical condition     (cold, fever, infections).     Do not wear jewelry, make-up, hairpins, clips or nail polish.  Do not wear lotions, powders, or perfumes.   Do not shave 48 hours prior to surgery. Men may shave face and neck.  Do not bring valuables to the hospital.    [facility] is not responsible for any belongings or valuables.               Contacts, dentures or bridgework may not be worn into surgery.  Leave your suitcase in the car. After surgery it may be brought to your room.  For patients admitted to the hospital, discharge time is determined by your                treatment team.   Patients discharged the day of surgery will not be allowed to drive home.   Please read over the following fact sheets that you were given:   Surgical Site Infection Prevention   ____ Take these medicines the morning of surgery with A SIP OF WATER:    1. NONE  2.   3.   4.  5.  6.  ____ Fleet Enema (as directed)   ____ Use CHG Soap as directed  ____ Use inhalers on the day of surgery  ____ Stop metformin 2 days prior to surgery    ____ Take 1/2 of usual insulin dose the night before surgery and none on the morning of surgery.   ____ Stop Coumadin/Plavix/aspirin on   ____  Stop Anti-inflammatories on    ____ Stop supplements until after surgery.    ____ Bring C-Pap to the hospital.

## 2016-04-21 ENCOUNTER — Ambulatory Visit
Admission: RE | Admit: 2016-04-21 | Payer: BLUE CROSS/BLUE SHIELD | Source: Ambulatory Visit | Admitting: Obstetrics and Gynecology

## 2016-04-21 ENCOUNTER — Encounter: Admission: RE | Payer: Self-pay | Source: Ambulatory Visit

## 2016-04-21 SURGERY — CONE BIOPSY, CERVIX
Anesthesia: Choice

## 2016-04-28 ENCOUNTER — Ambulatory Visit: Payer: BLUE CROSS/BLUE SHIELD | Admitting: Certified Registered"

## 2016-04-28 ENCOUNTER — Encounter
Admission: RE | Disposition: A | Discharge status: Home / Self Care | Payer: Self-pay | Source: Ambulatory Visit | Attending: Obstetrics and Gynecology

## 2016-04-28 ENCOUNTER — Encounter: Payer: Self-pay | Admitting: *Deleted

## 2016-04-28 ENCOUNTER — Ambulatory Visit
Admission: RE | Admit: 2016-04-28 | Discharge: 2016-04-28 | Disposition: A | Discharge status: Home / Self Care | Payer: BLUE CROSS/BLUE SHIELD | Source: Ambulatory Visit | Attending: Obstetrics and Gynecology | Admitting: Obstetrics and Gynecology

## 2016-04-28 DIAGNOSIS — Z809 Family history of malignant neoplasm, unspecified: Secondary | ICD-10-CM | POA: Insufficient documentation

## 2016-04-28 DIAGNOSIS — D069 Carcinoma in situ of cervix, unspecified: Secondary | ICD-10-CM | POA: Diagnosis not present

## 2016-04-28 DIAGNOSIS — F329 Major depressive disorder, single episode, unspecified: Secondary | ICD-10-CM | POA: Insufficient documentation

## 2016-04-28 DIAGNOSIS — F419 Anxiety disorder, unspecified: Secondary | ICD-10-CM | POA: Insufficient documentation

## 2016-04-28 DIAGNOSIS — Z833 Family history of diabetes mellitus: Secondary | ICD-10-CM | POA: Diagnosis not present

## 2016-04-28 DIAGNOSIS — Z801 Family history of malignant neoplasm of trachea, bronchus and lung: Secondary | ICD-10-CM | POA: Insufficient documentation

## 2016-04-28 DIAGNOSIS — C530 Malignant neoplasm of endocervix: Secondary | ICD-10-CM | POA: Diagnosis present

## 2016-04-28 HISTORY — PX: CERVICAL CONIZATION W/BX: SHX1330

## 2016-04-28 LAB — POCT PREGNANCY, URINE: Preg Test, Ur: NEGATIVE

## 2016-04-28 SURGERY — CONE BIOPSY, CERVIX
Anesthesia: General

## 2016-04-28 MED ORDER — PROMETHAZINE HCL 25 MG/ML IJ SOLN: 6.2500 mg | INTRAMUSCULAR | Status: DC | PRN

## 2016-04-28 MED ORDER — LIDOCAINE-EPINEPHRINE 1 %-1:100000 IJ SOLN
INTRAMUSCULAR | Status: DC | PRN
  Administered 2016-04-28: 11 mL

## 2016-04-28 MED ORDER — LACTATED RINGERS IV SOLN
INTRAVENOUS | Status: DC
  Administered 2016-04-28: 11:00:00 via INTRAVENOUS

## 2016-04-28 MED ORDER — MIDAZOLAM HCL 2 MG/2ML IJ SOLN
INTRAMUSCULAR | Status: DC | PRN
  Administered 2016-04-28: 2 mg via INTRAVENOUS

## 2016-04-28 MED ORDER — DEXAMETHASONE SODIUM PHOSPHATE 10 MG/ML IJ SOLN
INTRAMUSCULAR | Status: DC | PRN
  Administered 2016-04-28: 10 mg via INTRAVENOUS

## 2016-04-28 MED ORDER — FERRIC SUBSULFATE 259 MG/GM EX SOLN
CUTANEOUS | Status: AC
  Filled 2016-04-28: qty 8

## 2016-04-28 MED ORDER — IBUPROFEN 600 MG PO TABS: 600.0000 mg | ORAL_TABLET | Freq: Four times a day (QID) | ORAL | 0 refills | Status: DC | PRN

## 2016-04-28 MED ORDER — PROPOFOL 10 MG/ML IV BOLUS
INTRAVENOUS | Status: DC | PRN
  Administered 2016-04-28: 160 mg via INTRAVENOUS

## 2016-04-28 MED ORDER — LIDOCAINE HCL (CARDIAC) 20 MG/ML IV SOLN
INTRAVENOUS | Status: DC | PRN
  Administered 2016-04-28: 80 mg via INTRAVENOUS

## 2016-04-28 MED ORDER — FENTANYL CITRATE (PF) 100 MCG/2ML IJ SOLN
INTRAMUSCULAR | Status: AC
  Administered 2016-04-28: 25 ug via INTRAVENOUS
  Filled 2016-04-28: qty 2

## 2016-04-28 MED ORDER — LACTATED RINGERS IV SOLN: INTRAVENOUS | Status: DC

## 2016-04-28 MED ORDER — FENTANYL CITRATE (PF) 100 MCG/2ML IJ SOLN
INTRAMUSCULAR | Status: DC | PRN
  Administered 2016-04-28: 50 ug via INTRAVENOUS

## 2016-04-28 MED ORDER — LIDOCAINE-EPINEPHRINE 1 %-1:100000 IJ SOLN
INTRAMUSCULAR | Status: AC
  Filled 2016-04-28: qty 1

## 2016-04-28 MED ORDER — ONDANSETRON HCL 4 MG/2ML IJ SOLN
INTRAMUSCULAR | Status: DC | PRN
  Administered 2016-04-28: 4 mg via INTRAVENOUS

## 2016-04-28 MED ORDER — HYDROCODONE-ACETAMINOPHEN 5-325 MG PO TABS: 1.0000 | ORAL_TABLET | Freq: Four times a day (QID) | ORAL | 0 refills | Status: DC | PRN

## 2016-04-28 MED ORDER — PHENYLEPHRINE HCL 10 MG/ML IJ SOLN
INTRAMUSCULAR | Status: DC | PRN
  Administered 2016-04-28: 100 ug via INTRAVENOUS

## 2016-04-28 MED ORDER — EPHEDRINE SULFATE 50 MG/ML IJ SOLN
INTRAMUSCULAR | Status: DC | PRN
Start: 2016-04-28 — End: 2016-04-28
  Administered 2016-04-28: 10 mg via INTRAVENOUS

## 2016-04-28 MED ORDER — IODINE STRONG (LUGOL'S) SOLN
Status: DC | PRN
  Administered 2016-04-28: 5 mL via TOPICAL

## 2016-04-28 MED ORDER — FAMOTIDINE 20 MG PO TABS
ORAL_TABLET | ORAL | Status: AC
  Administered 2016-04-28: 20 mg via ORAL
  Filled 2016-04-28: qty 1

## 2016-04-28 MED ORDER — IODINE STRONG (LUGOLS) 5 % PO SOLN
ORAL | Status: AC
  Filled 2016-04-28: qty 1

## 2016-04-28 MED ORDER — FERRIC SUBSULFATE 259 MG/GM EX SOLN
CUTANEOUS | Status: DC | PRN
  Administered 2016-04-28: 1 via TOPICAL

## 2016-04-28 MED ORDER — FAMOTIDINE 20 MG PO TABS
20.0000 mg | ORAL_TABLET | Freq: Once | ORAL | Status: AC
  Administered 2016-04-28: 20 mg via ORAL

## 2016-04-28 MED ORDER — FENTANYL CITRATE (PF) 100 MCG/2ML IJ SOLN
25.0000 ug | INTRAMUSCULAR | Status: DC | PRN
  Administered 2016-04-28 (×4): 25 ug via INTRAVENOUS

## 2016-04-28 SURGICAL SUPPLY — 31 items
APPLICATOR COTTON TIP 6IN STRL (MISCELLANEOUS) ×12 IMPLANT
BLADE SURG SZ11 CARB STEEL (BLADE) ×3 IMPLANT
CATH ROBINSON RED A/P 16FR (CATHETERS) ×3 IMPLANT
CLEANER CAUTERY TIP 5X5 PAD (MISCELLANEOUS) ×1 IMPLANT
CNTNR SPEC 2.5X3XGRAD LEK (MISCELLANEOUS) ×1
CONT SPEC 4OZ STER OR WHT (MISCELLANEOUS) ×2
CONTAINER SPEC 2.5X3XGRAD LEK (MISCELLANEOUS) ×1 IMPLANT
COUNTER NEEDLE 20/40 LG (NEEDLE) ×3 IMPLANT
ELECT REM PT RETURN 9FT ADLT (ELECTROSURGICAL) ×3
ELECTRODE REM PT RTRN 9FT ADLT (ELECTROSURGICAL) ×1 IMPLANT
GLOVE BIO SURGEON STRL SZ8 (GLOVE) ×3 IMPLANT
GLOVE INDICATOR 7.0 STRL GRN (GLOVE) ×3 IMPLANT
GOWN STRL REUS W/ TWL LRG LVL3 (GOWN DISPOSABLE) ×1 IMPLANT
GOWN STRL REUS W/ TWL XL LVL3 (GOWN DISPOSABLE) ×1 IMPLANT
GOWN STRL REUS W/TWL LRG LVL3 (GOWN DISPOSABLE) ×2
GOWN STRL REUS W/TWL XL LVL3 (GOWN DISPOSABLE) ×2
HANDLE YANKAUER SUCT BULB TIP (MISCELLANEOUS) ×3 IMPLANT
KIT RM TURNOVER CYSTO AR (KITS) ×3 IMPLANT
NEEDLE SPNL 22GX3.5 QUINCKE BK (NEEDLE) ×3 IMPLANT
NS IRRIG 500ML POUR BTL (IV SOLUTION) ×3 IMPLANT
PACK BASIN MINOR ARMC (MISCELLANEOUS) IMPLANT
PACK DNC HYST (MISCELLANEOUS) ×3 IMPLANT
PAD CLEANER CAUTERY TIP 5X5 (MISCELLANEOUS) ×2
PAD OB MATERNITY 4.3X12.25 (PERSONAL CARE ITEMS) ×3 IMPLANT
PAD PREP 24X41 OB/GYN DISP (PERSONAL CARE ITEMS) ×3 IMPLANT
PENCIL ELECTRO HAND CTR (MISCELLANEOUS) ×3 IMPLANT
SUT CHROMIC 1-0 (SUTURE) ×15 IMPLANT
SUT VIC AB 0 CT1 27 (SUTURE) ×2
SUT VIC AB 0 CT1 27XCR 8 STRN (SUTURE) ×1 IMPLANT
SYR CONTROL 10ML (SYRINGE) ×3 IMPLANT
TRAY PREP VAG/GEN (MISCELLANEOUS) ×3 IMPLANT

## 2016-04-28 NOTE — Transfer of Care (Signed)
Immediate Anesthesia Transfer of Care Note  Patient: Madison [name]  Procedure(s) Performed: Procedure(s): CONIZATION CERVIX WITH BIOPSY (N/A)  Patient Location: PACU  Anesthesia Type:General  Level of Consciousness: awake, alert , oriented and patient cooperative  Airway & Oxygen Therapy: Patient Spontanous Breathing and Patient connected to face mask oxygen  Post-op Assessment: Report given to RN, Post -op Vital signs reviewed and stable and Patient moving all extremities X 4  Post vital signs: Reviewed and stable  Last Vitals:  Vitals:   04/28/16 1025  BP: 122/90  Pulse: 75  Resp: 16  Temp: 36.7 C    Last Pain:  Vitals:   04/28/16 1025  TempSrc: Oral         Complications: No apparent anesthesia complications

## 2016-04-28 NOTE — H&P (Signed)
History and Physical Interval Note:  Madison [name]  has presented today for surgery, with the diagnosis of CERVICAL ADENOCARCINOMA IN SITU  The various methods of treatment have been discussed with the patient and family. After consideration of risks, benefits and other options for treatment, the patient has consented to  Procedure(s): CONIZATION CERVIX WITH BIOPSY (N/A) as a surgical intervention .  The patient's history has been reviewed, patient examined, no change in status, stable for surgery.  I have reviewed the patient's chart and labs.  Questions were answered to the patient's satisfaction.    Will Bonnet, MD 04/28/2016 12:00 PM

## 2016-04-28 NOTE — Op Note (Signed)
  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 She was placed in the dorsal supine, high lithotomy position in candy cane stirrups.  She was prepped and draped in the usual sterile fashion.  After a timeout was called a weighted speculum was placed in the vagina with an anterior right-angle retractor placed.  Stay sutures of 0 vicryl were placed at 3 and 9 o'clock on the cervix.  A paracervical block was placed circumferentially around the cervix using lidocaine 1% with epinephrine.  Lugol's solution was used to assess dysplasia with no Lugol's negative areas noted.  An 11 blade was used to cut the cone specimen.  The specimen was tagged with a suture at 12 o'clock. An ECC was then performed.  Bovie cautery was used to ensure hemostasis at the surgical bed. Sutures were placed along the ectocervical epithelium in a running fashion.  A modified Sturmdorf suture technique was used to obtain hemostasis.   The cervical os was opened and Monsel's solution was placed on the bed. The stay sutures were removed and hemostasis was noted.  The patient tolerated the procedure well.  Sponge, lap, needle, and instrument counts were correct x 2.  VTE prophylaxis: SCDs. Antibiotic prophylaxis: none indicated. She was awakened in the operating room and was taken to the PACU in stable condition.    Prentice Docker, MD 04/28/2016 1:26 PM

## 2016-04-28 NOTE — Anesthesia Procedure Notes (Signed)
Procedure Name: LMA Insertion Date/Time: 04/28/2016 12:29 PM Performed by: Silvana Newness Pre-anesthesia Checklist: Patient identified, Emergency Drugs available, Suction available, Patient being monitored and Timeout performed Patient Re-evaluated:Patient Re-evaluated prior to inductionOxygen Delivery Method: Circle system utilized Preoxygenation: Pre-oxygenation with 100% oxygen Intubation Type: IV induction Ventilation: Mask ventilation without difficulty LMA: LMA inserted LMA Size: 3.5 Number of attempts: 1 Placement Confirmation: positive ETCO2 and breath sounds checked- equal and bilateral Tube secured with: Tape Dental Injury: Teeth and Oropharynx as per pre-operative assessment

## 2016-04-28 NOTE — Discharge Instructions (Signed)

## 2016-04-28 NOTE — Anesthesia Preprocedure Evaluation (Signed)
Anesthesia Evaluation  Patient identified by MRN, date of birth, ID band Patient awake    Reviewed: Allergy & Precautions, H&P , NPO status , Patient's Chart, lab work & pertinent test results, reviewed documented beta blocker date and time   History of Anesthesia Complications Negative for: history of anesthetic complications  Airway Mallampati: I  TM Distance: >3 FB Neck ROM: full    Dental no notable dental hx. (+) Teeth Intact   Pulmonary neg pulmonary ROS,    Pulmonary exam normal breath sounds clear to auscultation       Cardiovascular Exercise Tolerance: Good negative cardio ROS Normal cardiovascular exam Rhythm:regular Rate:Normal     Neuro/Psych negative neurological ROS  negative psych ROS   GI/Hepatic negative GI ROS, Neg liver ROS,   Endo/Other  negative endocrine ROS  Renal/GU negative Renal ROS  negative genitourinary   Musculoskeletal   Abdominal   Peds  Hematology negative hematology ROS (+)   Anesthesia Other Findings Past Medical History: No date: Anxiety No date: Depression   Reproductive/Obstetrics (+) Breast feeding                              Anesthesia Physical Anesthesia Plan  ASA: I  Anesthesia Plan: General   Post-op Pain Management:    Induction:   Airway Management Planned:   Additional Equipment:   Intra-op Plan:   Post-operative Plan:   Informed Consent: I have reviewed the patients History and Physical, chart, labs and discussed the procedure including the risks, benefits and alternatives for the proposed anesthesia with the patient or authorized representative who has indicated his/her understanding and acceptance.   Dental Advisory Given  Plan Discussed with: Anesthesiologist, CRNA and Surgeon  Anesthesia Plan Comments:         Anesthesia Quick Evaluation

## 2016-04-28 NOTE — Progress Notes (Signed)
No complaints of pain at present

## 2016-05-02 NOTE — Anesthesia Postprocedure Evaluation (Signed)
Anesthesia Post Note  Patient: Madison [name]  Procedure(s) Performed: Procedure(s) (LRB): CONIZATION CERVIX WITH BIOPSY (N/A)  Patient location during evaluation: PACU Anesthesia Type: General Level of consciousness: awake and alert Pain management: pain level controlled Vital Signs Assessment: post-procedure vital signs reviewed and stable Respiratory status: spontaneous breathing, nonlabored ventilation, respiratory function stable and patient connected to nasal cannula oxygen Cardiovascular status: blood pressure returned to baseline and stable Postop Assessment: no signs of nausea or vomiting Anesthetic complications: no    Last Vitals:  Vitals:   04/28/16 1500 04/28/16 1547  BP: 105/72 114/83  Pulse: 87 99  Resp:    Temp:      Last Pain:  Vitals:   04/29/16 0829  TempSrc:   PainSc: 0-No pain                 Molli Barrows

## 2016-05-04 LAB — SURGICAL PATHOLOGY

## 2016-05-11 ENCOUNTER — Inpatient Hospital Stay: Payer: BLUE CROSS/BLUE SHIELD | Attending: Obstetrics and Gynecology | Admitting: Obstetrics and Gynecology

## 2016-05-11 ENCOUNTER — Ambulatory Visit: Payer: BLUE CROSS/BLUE SHIELD

## 2016-05-11 VITALS — BP 119/83 | HR 86 | Temp 97.1°F | Ht 63.0 in | Wt 179.1 lb

## 2016-05-11 DIAGNOSIS — F329 Major depressive disorder, single episode, unspecified: Secondary | ICD-10-CM | POA: Diagnosis not present

## 2016-05-11 DIAGNOSIS — Z8541 Personal history of malignant neoplasm of cervix uteri: Secondary | ICD-10-CM | POA: Insufficient documentation

## 2016-05-11 DIAGNOSIS — F418 Other specified anxiety disorders: Secondary | ICD-10-CM | POA: Diagnosis not present

## 2016-05-11 DIAGNOSIS — D060 Carcinoma in situ of endocervix: Secondary | ICD-10-CM | POA: Insufficient documentation

## 2016-05-11 DIAGNOSIS — C539 Malignant neoplasm of cervix uteri, unspecified: Secondary | ICD-10-CM | POA: Insufficient documentation

## 2016-05-11 NOTE — Progress Notes (Signed)
Gynecologic Oncology Consult Visit   Referring Provider: Dr. 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 [id number] female who is seen in consultation from Dr. Prentice Docker for evaluation of invasive adenocarcinoma of uterine cervix. She underwent CKC on 04/28/2016.   DIAGNOSIS:  A. CERVIX; COLD KNIFE CONIZATION:  - INVASIVE ENDOCERVICAL ADENOCARCINOMA, USUAL TYPE.  - Moderately differentiated - LYMPH VASCULAR INVASION IS PRESENT.  - THE DEEP AND ECTOCERVICAL MARGINS ARE POSITIVE.   Greatest dimension 1.5 cm Additional Dimension (cm) 1.2cm Additional  Dimension (cm) 1cm  Stromal Invasion:  Depth (mm):  26mm  Horizontal Extent (mm): 65mm      B. ENDOCERVIX; CURRETAGE:  - TISSUE IS ABSENT UPON PROCESSING.    Based on pelvic exam from 01/21/2016 there was no evidence of gross disease.   Problem List: Patient Active Problem List   Diagnosis Date Noted  . Malignant neoplasm of cervix ([place]) 05/11/2016  . Adenocarcinoma in situ (AIS) of uterine cervix 04/28/2016  . Status post cesarean section 12/08/2015    Past Medical History: Past Medical History:  Diagnosis Date  . Anxiety   . Depression     Past Surgical History: Past Surgical History:  Procedure Laterality Date  . BREAST ENHANCEMENT SURGERY Bilateral   . BREAST SURGERY    . CERVICAL CONIZATION W/BX N/A 04/28/2016   Procedure: CONIZATION CERVIX WITH BIOPSY;  Surgeon: Will Bonnet, MD;  Location: [facility] ORS;  Service: Gynecology;  Laterality: N/A;  . CESAREAN SECTION    . CESAREAN SECTION WITH BILATERAL TUBAL LIGATION Bilateral 12/08/2015   Procedure: CESAREAN SECTION WITH BILATERAL TUBAL LIGATION;  Surgeon: Will Bonnet, MD;  Location: [facility] ORS;  Service: Obstetrics;  Laterality: Bilateral;  . DILATION AND CURETTAGE OF UTERUS      Past Gynecologic History:  Menarche: 12 Menstrual details: Lasts 3 days Menses regular: YES   Last Menstrual Period: 05/07/2016 History of Abnormal pap: See HPI Contraception: bilateral tubal ligation Sexually active: yes  OB History:  OB History  Gravida Para Term Preterm AB Living  7 4 4   3 4   SAB TAB Ectopic Multiple Live Births  3     0 4    # Outcome Date GA Lbr Len/2nd Weight Sex Delivery Anes PTL Lv  7 Term 12/08/15 [redacted]w[redacted]d  9 lb 3.8 oz (4.19 kg) M CS-Vac Spinal  LIV  6 SAB 09/2014          5 Term 01/28/13     CS-LTranv   LIV  4 SAB 07/2011          3 Term 12/15/10     CS-LTranv   LIV  2 SAB 04/2008          1 Term 07/13/98     Vag-Spont   LIV      Family History: Family History  Problem Relation Age of Onset  . Diabetes Mellitus II Father   . Lung cancer Maternal Grandmother   . Cancer Paternal Grandmother     unknown primary    Social History: Social History   Social History  . Marital status: Single    Spouse name: N/A  . Number of children: N/A  . Years of education: N/A   Occupational History  . Not on file.   Social History Main Topics  . Smoking status: Never Smoker  . Smokeless tobacco: Never Used  . Alcohol use No  . Drug use: No  .  Sexual activity: Yes   Other Topics Concern  . Not on file   Social History Narrative  . No narrative on file    Allergies: No Known Allergies  Current Medications: Current Outpatient Prescriptions  Medication Sig Dispense Refill  . HYDROcodone-acetaminophen (NORCO) 5-325 MG tablet Take 1 tablet by mouth every 6 (six) hours as needed for moderate pain. 20 tablet 0  . ibuprofen (ADVIL,MOTRIN) 600 MG tablet Take 1 tablet (600 mg total) by mouth every 6 (six) hours as needed for mild pain or cramping. 30 tablet 0  . Prenatal Vit-Fe Fumarate-FA (PRENATAL VITAMIN PO) Take 1 tablet by mouth daily.     No current facility-administered medications for this visit.     Review of Systems General: no complaints  HEENT: no complaints  Lungs: no complaints  Cardiac: no complaints  GI: no complaints   GU: no complaints  Musculoskeletal: no complaints  Extremities: no complaints  Skin: no complaints  Neuro: no complaints  Endocrine: no complaints  Psych: no complaints       Objective:  Physical Examination:  Vitals:   05/11/16 1045  BP: 119/83  Pulse: 86  Temp: 97.1 F (36.2 C)  Body mass index is 31.73 kg/m.    ECOG Performance Status: 0 - Asymptomatic  General appearance: alert, cooperative and appears stated age HEENT:PERRLA, extra ocular movement intact and sclera clear, anicteric Lymph node survey: non-palpable, axillary, inguinal, supraclavicular Cardiovascular: regular rate and rhythm Respiratory: normal air entry, lungs clear to auscultation Abdomen: soft, non-tender, without masses or organomegaly, nondistended, no hernias and well healed incisions Back: inspection of back is normal Extremities: extremities normal, atraumatic, no cyanosis or edema Skin exam - normal coloration and turgor, no rashes, no suspicious skin lesions noted. Neurological exam reveals alert, oriented, normal speech, no focal findings or movement disorder noted.  Pelvic: exam chaperoned by nurse;  Vulva: normal appearing vulva with no masses, tenderness or lesions; Vagina: normal vagina; Adnexa: normal adnexa in size, nontender and no masses; Uterus: uterus is normal size, shape, consistency and nontender; Cervix: no lesions, stitich in place and os not visualized, soft to palpation; parametria smooth bilaterally without nodularity   Lab Review n/a  Radiologic Imaging: PET/CT ordered    Assessment:  Madison [name] is a 35 y.o. 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 Medical co-morbidities complicating care: obesity and prior abdominal surgery.  Plan:   Problem List Items Addressed This Visit      Genitourinary   Malignant neoplasm of cervix ([place]) - Primary    Other Visit Diagnoses   None.     We discussed options for management  including surgery vs radiation. Based on her age and exam , I recommend definitive surgical evaluation with radical hysterectomy and bilateral salpingectomy. Given her young age I recommended preservation of the ovaries.   Pathology review at [facility]. I suspect stage IB1 disease.   Obtain PET/CT. Plan for radical hysterectomy and bilateral salpingectomy at [facility]. Visit at [facility] in approximately 3 weeks.   The patient's diagnosis, an outline of the further diagnostic and laboratory studies which will be required, the recommendation, and alternatives were discussed.  All questions were answered to the patient's satisfaction.     Gillis Ends, MD    CC:  Dr. Prentice Docker

## 2016-05-11 NOTE — Progress Notes (Signed)
Patient here for referral. No complaints of pain or discomfort today.

## 2016-05-11 NOTE — Progress Notes (Signed)
  Oncology Nurse Navigator Documentation Chaperoned pelvic exam. [facility] notified with confirmation of stat pathology consult request from Dr Theora Gianotti. Records sent to [facility] for scheduling. Patient will have surgery performed at [facility]. Provided her with my contact information for any future questions or needs. PET scheduled and she was notified by scheduling of this appt with instructions. Navigator Location: [facility] (05/11/16 1400) Navigator Encounter Type: Clinic/MDC (05/11/16 1400)           Patient Visit Type: Initial (Gyn Onc) (05/11/16 1400) Treatment Phase: Pre-Tx/Tx Discussion (05/11/16 1400) Barriers/Navigation Needs: Coordination of Care (05/11/16 1400)   Interventions: Coordination of Care (05/11/16 1400)   Coordination of Care: Appts (05/11/16 1400)        Acuity: Level 2 (05/11/16 1400)   Acuity Level 2: Initial guidance, education and coordination as needed;Educational needs;Assistance expediting appointments;Ongoing guidance and education throughout treatment as needed (05/11/16 1400)     Time Spent with Patient: 45 (05/11/16 1400)

## 2016-05-16 ENCOUNTER — Encounter
Admission: RE | Admit: 2016-05-16 | Discharge: 2016-05-16 | Disposition: A | Discharge status: Home / Self Care | Payer: BLUE CROSS/BLUE SHIELD | Source: Ambulatory Visit | Attending: Obstetrics and Gynecology | Admitting: Obstetrics and Gynecology

## 2016-05-16 DIAGNOSIS — C539 Malignant neoplasm of cervix uteri, unspecified: Secondary | ICD-10-CM

## 2016-05-16 LAB — GLUCOSE, CAPILLARY: Glucose-Capillary: 84 mg/dL (ref 65–99)

## 2016-05-16 MED ORDER — FLUDEOXYGLUCOSE F - 18 (FDG) INJECTION
12.8090 | Freq: Once | INTRAVENOUS | Status: AC | PRN
  Administered 2016-05-16: 12.809 via INTRAVENOUS

## 2016-05-18 ENCOUNTER — Telehealth: Payer: Self-pay | Admitting: Obstetrics and Gynecology

## 2016-05-18 NOTE — Telephone Encounter (Signed)
Attempted to contact patient regarding her PET scan. Her line was busy. The study is negative for metastatic disease which is wonderful news.  Gillis Ends, MD

## 2016-05-19 ENCOUNTER — Telehealth: Payer: Self-pay

## 2016-05-19 NOTE — Telephone Encounter (Signed)
  Oncology Nurse Navigator Documentation Voicemail left for patient to return call. Would like to give her results of PET scan. Navigator Location: [facility] (05/19/16 1500) Navigator Encounter Type: Telephone (05/19/16 1500) Telephone: Diagnostic Results (05/19/16 1500)                                        Time Spent with Patient: 15 (05/19/16 1500)

## 2016-06-10 DIAGNOSIS — H5711 Ocular pain, right eye: Secondary | ICD-10-CM

## 2016-06-10 HISTORY — DX: Ocular pain, right eye: H57.11

## 2016-06-21 ENCOUNTER — Telehealth: Payer: Self-pay

## 2016-06-21 ENCOUNTER — Ambulatory Visit: Payer: BLUE CROSS/BLUE SHIELD

## 2016-06-21 NOTE — Telephone Encounter (Signed)
  Oncology Nurse Navigator Documentation Spoke with Ms. Snelling. She will come in today for voiding trial at 11:30. Navigator Location: [facility] (06/21/16 0900)   )Navigator Encounter Type: Telephone;Follow-up Appt (06/21/16 0900)                                                    Time Spent with Patient: 15 (06/21/16 0900)

## 2016-06-21 NOTE — Progress Notes (Signed)
  Oncology Nurse Navigator Documentation Voiding trial performed. Instilled 35ml of sterile water into foley catheter. Foley then removed per policy. Patient immediately voided 3109ml of clear liquid with no discomfort. Instructed to call if she is unable to void once she returns home. Will arrange for 4-6 week surgical follow up with Dr. Theora Gianotti. Navigator Location: [facility] (06/21/16 1200)   )Navigator Encounter Type: Follow-up Appt;Other (Voiding trial) (06/21/16 1200)                                                    Time Spent with Patient: 45 (06/21/16 1200)

## 2016-06-22 ENCOUNTER — Telehealth: Payer: Self-pay

## 2016-06-22 NOTE — Telephone Encounter (Signed)
  Oncology Nurse Navigator Documentation Notified that her Attending 40 Statement was available for pick up at the front desk. She has received her post op appt for 11/29 Navigator Location: [facility] (06/22/16 1600)   )Navigator Encounter Type: Letter/Fax/Email;Telephone (06/22/16 1600)                                                    Time Spent with Patient: 15 (06/22/16 1600)

## 2016-07-11 ENCOUNTER — Encounter: Payer: Self-pay | Admitting: Family Medicine

## 2016-07-11 ENCOUNTER — Ambulatory Visit (INDEPENDENT_AMBULATORY_CARE_PROVIDER_SITE_OTHER): Payer: BLUE CROSS/BLUE SHIELD | Admitting: Family Medicine

## 2016-07-11 VITALS — BP 117/79 | HR 87 | Temp 98.0°F | Wt 185.2 lb

## 2016-07-11 DIAGNOSIS — F411 Generalized anxiety disorder: Secondary | ICD-10-CM

## 2016-07-11 DIAGNOSIS — F419 Anxiety disorder, unspecified: Secondary | ICD-10-CM | POA: Insufficient documentation

## 2016-07-11 MED ORDER — HYDROXYZINE HCL 25 MG PO TABS: 25.0000 mg | ORAL_TABLET | Freq: Three times a day (TID) | ORAL | 3 refills | Status: DC | PRN

## 2016-07-11 MED ORDER — ESCITALOPRAM OXALATE 5 MG PO TABS: 5.0000 mg | ORAL_TABLET | Freq: Every day | ORAL | 1 refills | Status: DC

## 2016-07-11 NOTE — Assessment & Plan Note (Addendum)
Will start her on lexapro and hydroxyzine daily. Wants to avoid benzos at this time. Will continue to monitor and call with any concerns.

## 2016-07-11 NOTE — Progress Notes (Signed)
BP 117/79 (BP Location: Left Arm, Patient Position: Sitting, Cuff Size: Normal)   Pulse 87   Temp 98 F (36.7 C)   Wt 185 lb 3.2 oz (84 kg)   LMP 05/07/2016 (Exact Date)   SpO2 97%   Breastfeeding? No   BMI 32.81 kg/m    Subjective:    Patient ID: Madison [name], female    DOB: Jun 14, 1981, 35 y.o.   MRN: [id number]  HPI: Madison [name] is a 35 y.o. female  Chief Complaint  Patient presents with  . Anxiety   ANXIETY/STRESS- recently diagnosed with cervical cancer. Had hysterectomy. Had PET scan. Everything looks good. Has not been feeling like herself. Duration:uncontrolled Anxious mood: yes  Excessive worrying: yes Irritability: yes  Sweating: no Nausea: no Palpitations:yes Hyperventilation: no Panic attacks: no Agoraphobia: no  Obscessions/compulsions: yes Depressed mood: no Depression screen PHQ 2/9 07/11/2016  Decreased Interest 0  Down, Depressed, Hopeless 0  PHQ - 2 Score 0    GAD 7 : Generalized Anxiety Score 07/11/2016  Nervous, Anxious, on Edge 2  Control/stop worrying 2  Worry too much - different things 2  Trouble relaxing 2  Restless 0  Easily annoyed or irritable 3  Afraid - awful might happen 2  Total GAD 7 Score 13  Anxiety Difficulty Very difficult  Anhedonia: no Weight changes: no Insomnia: no   Hypersomnia: no Fatigue/loss of energy: yes Feelings of worthlessness: no Feelings of guilt: no Impaired concentration/indecisiveness: no Suicidal ideations: no  Crying spells: no Recent Stressors/Life Changes: yes  Was on wellbutrin and zoloft in the past  Relevant past medical, surgical, family and social history reviewed and updated as indicated. Interim medical history since our last visit reviewed. Allergies and medications reviewed and updated.  Review of Systems  Constitutional: Negative.   Respiratory: Negative.   Cardiovascular: Negative.   Psychiatric/Behavioral: Negative.     Per HPI unless specifically indicated above     Objective:    BP 117/79 (BP Location: Left Arm, Patient Position: Sitting, Cuff Size: Normal)   Pulse 87   Temp 98 F (36.7 C)   Wt 185 lb 3.2 oz (84 kg)   LMP 05/07/2016 (Exact Date)   SpO2 97%   Breastfeeding? No   BMI 32.81 kg/m   Wt Readings from Last 3 Encounters:  07/11/16 185 lb 3.2 oz (84 kg)  05/11/16 179 lb 2 oz (81.3 kg)  04/28/16 180 lb (81.6 kg)    Physical Exam  Constitutional: She is oriented to person, place, and time. She appears well-developed and well-nourished. No distress.  HENT:  Head: Normocephalic and atraumatic.  Right Ear: Hearing normal.  Left Ear: Hearing normal.  Nose: Nose normal.  Eyes: Conjunctivae and lids are normal. Right eye exhibits no discharge. Left eye exhibits no discharge. No scleral icterus.  Pulmonary/Chest: Effort normal. No respiratory distress.  Musculoskeletal: Normal range of motion.  Neurological: She is alert and oriented to person, place, and time.  Skin: Skin is warm, dry and intact. No rash noted. She is not diaphoretic. No erythema. No pallor.  Psychiatric: She has a normal mood and affect. Her speech is normal and behavior is normal. Judgment and thought content normal. Cognition and memory are normal.  Nursing note and vitals reviewed.   Results for orders placed or performed during the hospital encounter of 05/16/16  Glucose, capillary  Result Value Ref Range   Glucose-Capillary 84 65 - 99 mg/dL      Assessment & Plan:   Problem  List Items Addressed This Visit      Other   Anxiety disorder - Primary    Will start her on lexapro and hydroxyzine daily. Wants to avoid benzos at this time. Will continue to monitor and call with any concerns.           Follow up plan: Return in about 4 weeks (around 08/08/2016) for Follow up anxiety.

## 2016-07-20 ENCOUNTER — Inpatient Hospital Stay: Payer: BLUE CROSS/BLUE SHIELD | Attending: Obstetrics and Gynecology | Admitting: Obstetrics and Gynecology

## 2016-07-20 ENCOUNTER — Telehealth: Payer: Self-pay

## 2016-07-20 VITALS — BP 135/89 | HR 97 | Ht 63.0 in | Wt 183.5 lb

## 2016-07-20 DIAGNOSIS — N898 Other specified noninflammatory disorders of vagina: Secondary | ICD-10-CM | POA: Insufficient documentation

## 2016-07-20 DIAGNOSIS — C530 Malignant neoplasm of endocervix: Secondary | ICD-10-CM | POA: Diagnosis present

## 2016-07-20 DIAGNOSIS — C539 Malignant neoplasm of cervix uteri, unspecified: Secondary | ICD-10-CM

## 2016-07-20 LAB — WET PREP, GENITAL
Clue Cells Wet Prep HPF POC: NONE SEEN
SPERM: NONE SEEN
Trich, Wet Prep: NONE SEEN
Yeast Wet Prep HPF POC: NONE SEEN

## 2016-07-20 NOTE — Telephone Encounter (Signed)
  Oncology Nurse Navigator Documentation Voicemail left to return call. Would like to notify of negative wet prep results. Navigator Location: [facility] (07/20/16 1500)   )Navigator Encounter Type: Diagnostic Results (07/20/16 1500)                                                    Time Spent with Patient: 15 (07/20/16 1500)

## 2016-07-20 NOTE — Progress Notes (Signed)
Gynecologic Oncology Visit   Referring Provider: Dr. 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 [id number] female who is seen in consultation from Dr. Prentice Docker for evaluation of invasive adenocarcinoma of uterine cervix. She presents for postop check s/p robotic-assisted radical hysterectomy, bilateral salpingectomy, ureterolysis, bilateral sentinel lymph node mapping and biopsy, repair of serotomy for Cervical adenocarcinoma.    She is doing well postoperatively and has not complaints. Her bladder function is normal.    Pathology: Invasive endocervical adenocarcinoma, involving 3:00 to 6:00 and 6:00 to 9:00 quadrants. Tumor invades 5/13 mm.  Mucinous Adenocarcinoma Subtype:Endocervical  Histologic Grade:G2: Moderately differentiated  Tumor Size:Greatest dimension (cm): 1.2 cm  Stromal Invasion:  Depth (mm):8 mm  Horizontal Extent (mm):9 mm  Lymph-Vascular Invasion:Not identified  MARGINS  Margins:  Margins:Uninvolved by invasive carcinoma  Distance of Invasive Carcinoma from Closest Margin:Specify (mm): 7 mm  Bilateral SLNs negative (0/3)   Paracervical endometriosis.  Bilateral fallopian tubes: Negative for malignancy. Six lymph parametrial nodes negative for metastatic carcinoma. (0/6)  No further therapy recommended.   Gynecologic Oncology Madison [name] is a 35 y.o. [phone] female who is seen in consultation from Dr. Prentice Docker for evaluation of invasive adenocarcinoma of uterine cervix.   04/28/2016 [facility] on revealed stage IB1 adenocarcinoma of the cervix.  DIAGNOSIS:  A. CERVIX; COLD KNIFE CONIZATION:  - INVASIVE ENDOCERVICAL ADENOCARCINOMA, USUAL TYPE.  - Moderately differentiated - LYMPH VASCULAR INVASION IS PRESENT.  - THE DEEP AND ECTOCERVICAL MARGINS ARE  POSITIVE.   Greatest dimension 1.5 cm Additional Dimension (cm) 1.2cm Additional  Dimension (cm) 1cm  Stromal Invasion:  Depth (mm):  62mm  Horizontal Extent (mm): 74mm   Problem List: Patient Active Problem List   Diagnosis Date Noted  . Vaginal discharge 07/20/2016  . Cervical cancer, FIGO stage IB1 ([place]) 07/20/2016  . Anxiety disorder 07/11/2016  . Malignant neoplasm of cervix ([place]) 05/11/2016  . Adenocarcinoma in situ (AIS) of uterine cervix 04/28/2016  . Status post cesarean section 12/08/2015    Past Medical History: Past Medical History:  Diagnosis Date  . Anxiety   . Cancer [facility]) 2017   Cervical  . Depression     Past Surgical History: Past Surgical History:  Procedure Laterality Date  . ABDOMINAL HYSTERECTOMY     Still has ovaries  . BREAST ENHANCEMENT SURGERY Bilateral   . BREAST SURGERY    . CERVICAL CONIZATION W/BX N/A 04/28/2016   Procedure: CONIZATION CERVIX WITH BIOPSY;  Surgeon: Will Bonnet, MD;  Location: [facility] ORS;  Service: Gynecology;  Laterality: N/A;  . CESAREAN SECTION    . CESAREAN SECTION WITH BILATERAL TUBAL LIGATION Bilateral 12/08/2015   Procedure: CESAREAN SECTION WITH BILATERAL TUBAL LIGATION;  Surgeon: Will Bonnet, MD;  Location: [facility] ORS;  Service: Obstetrics;  Laterality: Bilateral;  . DILATION AND CURETTAGE OF UTERUS      Past Gynecologic History:  Menarche: 12 Menstrual details: Lasted 3 days Menses regular: YES  Last Menstrual Period: n/a s/p hysterectomy History of Abnormal pap: See HPI Contraception: bilateral tubal ligation Sexually active: yes  OB History:  OB History  Gravida Para Term Preterm AB Living  7 4 4   3 4   SAB TAB Ectopic Multiple Live Births  3     0 4    # Outcome Date GA Lbr Len/2nd Weight Sex Delivery Anes PTL Lv  7 Term 12/08/15 [redacted]w[redacted]d  9 lb 3.8 oz (4.19 kg) M CS-Vac Spinal  LIV  6 SAB 09/2014          5 Term 01/28/13     CS-LTranv   LIV  4 SAB 07/2011          3 Term 12/15/10      CS-LTranv   LIV  2 SAB 04/2008          1 Term 07/13/98     Vag-Spont   LIV      Family History: Family History  Problem Relation Age of Onset  . Diabetes Mellitus II Father   . Lung cancer Maternal Grandmother   . Cancer Paternal Grandmother     unknown primary    Social History: Social History   Social History  . Marital status: Married    Spouse name: N/A  . Number of children: N/A  . Years of education: N/A   Occupational History  . Not on file.   Social History Main Topics  . Smoking status: Never Smoker  . Smokeless tobacco: Never Used  . Alcohol use No  . Drug use: No  . Sexual activity: Yes   Other Topics Concern  . Not on file   Social History Narrative  . No narrative on file    Allergies: Allergies  Allergen Reactions  . Tape Other (See Comments)    Burns skin    Current Medications: Current Outpatient Prescriptions  Medication Sig Dispense Refill  . escitalopram (LEXAPRO) 5 MG tablet Take 1 tablet (5 mg total) by mouth daily. 1/2 tab for first week, then increase to 1 tab daily 30 tablet 1  . hydrOXYzine (ATARAX/VISTARIL) 25 MG tablet Take 1 tablet (25 mg total) by mouth 3 (three) times daily as needed. 90 tablet 3   No current facility-administered medications for this visit.     Review of Systems General: no complaints  HEENT: no complaints  Lungs: no complaints  Cardiac: no complaints  GI: no complaints  GU: no complaints  Musculoskeletal: no complaints  Extremities: no complaints  Skin: no complaints  Neuro: no complaints  Endocrine: no complaints  Psych: no complaints       Objective:  Physical Examination:  BP 135/89 (BP Location: Left Arm, Patient Position: Sitting)   Pulse 97   Ht 5\' 3"  (1.6 m)   Wt 183 lb 8.5 oz (83.3 kg)   LMP 05/07/2016 (Exact Date)   BMI 32.51 kg/m     ECOG Performance Status: 0 - Asymptomatic  General appearance: alert, cooperative and appears stated age HEENT:PERRLA, extra ocular  movement intact and sclera clear, anicteric Abdomen: soft, non-tender, without masses or organomegaly, nondistended, no hernias and well healed incisions Extremities: extremities normal, atraumatic, no cyanosis or edema Skin exam - normal coloration and turgor, no rashes, no suspicious skin lesions noted. Neurological exam reveals alert, oriented, normal speech, no focal findings or movement disorder noted.  Pelvic: exam chaperoned by nurse;  Vulva: normal appearing vulva with no masses, tenderness or lesions; Vagina: normal vagina, vaginal cuff healed, white/yellowish vaginal discharge; Adnexa: nontender and no masses; Uterus/Cervix: surgically absent. BME no masses or nodularity. RV deferred.    Lab Review n/a  Radiologic Imaging: n/a    Assessment:  GWYNEVERE MONTEL is a 35 y.o. female diagnosed with probably stage IB1cervical cancer, adenocarcinoma, grade 2, s/p radical hysterectomy, bilateral SLN negative margins, negative parameteria and negative LVSI. Vaginal discharge, asymptomatic.  Medical co-morbidities complicating care: obesity and prior abdominal surgery.  Plan:  Problem List Items Addressed This Visit      Other   Cervical cancer, FIGO stage IB1 ([place]) - Primary   Vaginal discharge   Relevant Orders   Wet prep, genital      We reviewed pathology from radical hysterectomy and bilateral salpingectomy at [facility]. Pathology results are reassuring and no need for adjuvant therapy. Close follow up is warranted.   If NED I have recommended continued close follow up with exams, including pelvic exams every 3-6 months for 2 years, then every 6-12 months for 3-5 years and then annually thereafter. We can begin alternating with Dr. Glennon Mac after her next visit.  Cervical/vaginal cytology may be considered annually, but is thought to have limited value for detecting recurrent cervical cancer. Imaging and laboratory assessment is based on clinical indication. Patient education for  obesity, lifestyle, exercise, nutrition,sexual health, vaginal dilator use, vaginal lubricants/moisturizers.   We will check wet prep today to assess vaginal discharge. Even though she is not symptomatic I would recommend treatment if she has an infection given recent surgery.   The patient's diagnosis, an outline of the further diagnostic and laboratory studies which will be required, the recommendation, and alternatives were discussed.  All questions were answered to the patient's satisfaction.    Gillis Ends, MD    CC:  Dr. Prentice Docker

## 2016-07-20 NOTE — Patient Instructions (Signed)
If NED I have recommended continued close follow up with exams, including pelvic exams every 3-6 months for 2 years, then every 6-12 months for 3-5 years and then annually thereafter. We can begin alternating with Dr. Glennon Mac after her next visit.  Cervical/vaginal cytology may be considered annually, but is thought to have limited value for detecting recurrent cervical cancer. Imaging and laboratory assessment is based on clinical indication. Patient education for obesity, lifestyle, exercise, nutrition,sexual health, vaginal dilator use, vaginal lubricants/moisturizers.    Calorie Counting for Weight Loss Calories are energy you get from the things you eat and drink. Your body uses this energy to keep you going throughout the day. The number of calories you eat affects your weight. When you eat more calories than your body needs, your body stores the extra calories as fat. When you eat fewer calories than your body needs, your body burns fat to get the energy it needs. Calorie counting means keeping track of how many calories you eat and drink each day. If you make sure to eat fewer calories than your body needs, you should lose weight. In order for calorie counting to work, you will need to eat the number of calories that are right for you in a day to lose a healthy amount of weight per week. A healthy amount of weight to lose per week is usually 1-2 lb (0.5-0.9 kg). A dietitian can determine how many calories you need in a day and give you suggestions on how to reach your calorie goal.  WHAT IS MY MY PLAN? My goal is to have __________ calories per day.  If I have this many calories per day, I should lose around __________ pounds per week. WHAT DO I NEED TO KNOW ABOUT CALORIE COUNTING? In order to meet your daily calorie goal, you will need to:  Find out how many calories are in each food you would like to eat. Try to do this before you eat.  Decide how much of the food you can eat.  Write down  what you ate and how many calories it had. Doing this is called keeping a food log. WHERE DO I FIND CALORIE INFORMATION? The number of calories in a food can be found on a Nutrition Facts label. Note that all the information on a label is based on a specific serving of the food. If a food does not have a Nutrition Facts label, try to look up the calories online or ask your dietitian for help. HOW DO I DECIDE HOW MUCH TO EAT? To decide how much of the food you can eat, you will need to consider both the number of calories in one serving and the size of one serving. This information can be found on the Nutrition Facts label. If a food does not have a Nutrition Facts label, look up the information online or ask your dietitian for help. Remember that calories are listed per serving. If you choose to have more than one serving of a food, you will have to multiply the calories per serving by the amount of servings you plan to eat. For example, the label on a package of bread might say that a serving size is 1 slice and that there are 90 calories in a serving. If you eat 1 slice, you will have eaten 90 calories. If you eat 2 slices, you will have eaten 180 calories. HOW DO I KEEP A FOOD LOG? After each meal, record the following information in your food  log:  What you ate.  How much of it you ate.  How many calories it had.  Then, add up your calories. Keep your food log near you, such as in a small notebook in your pocket. Another option is to use a mobile app or website. Some programs will calculate calories for you and show you how many calories you have left each time you add an item to the log. WHAT ARE SOME CALORIE COUNTING TIPS?  Use your calories on foods and drinks that will fill you up and not leave you hungry. Some examples of this include foods like nuts and nut butters, vegetables, lean proteins, and high-fiber foods (more than 5 g fiber per serving).  Eat nutritious foods and avoid empty  calories. Empty calories are calories you get from foods or beverages that do not have many nutrients, such as candy and soda. It is better to have a nutritious high-calorie food (such as an avocado) than a food with few nutrients (such as a bag of chips).  Know how many calories are in the foods you eat most often. This way, you do not have to look up how many calories they have each time you eat them.  Look out for foods that may seem like low-calorie foods but are really high-calorie foods, such as baked goods, soda, and fat-free candy.  Pay attention to calories in drinks. Drinks such as sodas, specialty coffee drinks, alcohol, and juices have a lot of calories yet do not fill you up. Choose low-calorie drinks like water and diet drinks.  Focus your calorie counting efforts on higher calorie items. Logging the calories in a garden salad that contains only vegetables is less important than calculating the calories in a milk shake.  Find a way of tracking calories that works for you. Get creative. Most people who are successful find ways to keep track of how much they eat in a day, even if they do not count every calorie. WHAT ARE SOME PORTION CONTROL TIPS?  Know how many calories are in a serving. This will help you know how many servings of a certain food you can have.  Use a measuring cup to measure serving sizes. This is helpful when you start out. With time, you will be able to estimate serving sizes for some foods.  Take some time to put servings of different foods on your favorite plates, bowls, and cups so you know what a serving looks like.  Try not to eat straight from a bag or box. Doing this can lead to overeating. Put the amount you would like to eat in a cup or on a plate to make sure you are eating the right portion.  Use smaller plates, glasses, and bowls to prevent overeating. This is a quick and easy way to practice portion control. If your plate is smaller, less food can fit  on it.  Try not to multitask while eating, such as watching TV or using your computer. If it is time to eat, sit down at a table and enjoy your food. Doing this will help you to start recognizing when you are full. It will also make you more aware of what and how much you are eating. HOW CAN I CALORIE COUNT WHEN EATING OUT?  Ask for smaller portion sizes or child-sized portions.  Consider sharing an entree and sides instead of getting your own entree.  If you get your own entree, eat only half. Ask for a box at  the beginning of your meal and put the rest of your entree in it so you are not tempted to eat it.  Look for the calories on the menu. If calories are listed, choose the lower calorie options.  Choose dishes that include vegetables, fruits, whole grains, low-fat dairy products, and lean protein. Focusing on smart food choices from each of the 5 food groups can help you stay on track at restaurants.  Choose items that are boiled, broiled, grilled, or steamed.  Choose water, milk, unsweetened iced tea, or other drinks without added sugars. If you want an alcoholic beverage, choose a lower calorie option. For example, a regular margarita can have up to 700 calories and a glass of wine has around 150.  Stay away from items that are buttered, battered, fried, or served with cream sauce. Items labeled "crispy" are usually fried, unless stated otherwise.  Ask for dressings, sauces, and syrups on the side. These are usually very high in calories, so do not eat much of them.  Watch out for salads. Many people think salads are a healthy option, but this is often not the case. Many salads come with bacon, fried chicken, lots of cheese, fried chips, and dressing. All of these items have a lot of calories. If you want a salad, choose a garden salad and ask for grilled meats or steak. Ask for the dressing on the side, or ask for olive oil and vinegar or lemon to use as dressing.  Estimate how many  servings of a food you are given. For example, a serving of cooked rice is  cup or about the size of half a tennis ball or one cupcake wrapper. Knowing serving sizes will help you be aware of how much food you are eating at restaurants. The list below tells you how big or small some common portion sizes are based on everyday objects.  1 oz-4 stacked dice.  3 oz-1 deck of cards.  1 tsp-1 dice.  1 Tbsp- a Ping-Pong ball.  2 Tbsp-1 Ping-Pong ball.   cup-1 tennis ball or 1 cupcake wrapper.  1 cup-1 baseball. This information is not intended to replace advice given to you by your health care provider. Make sure you discuss any questions you have with your health care provider. Document Released: 08/08/2005 Document Revised: 08/29/2014 Document Reviewed: 06/13/2013 Elsevier Interactive Patient Education  2017 [employer].  Exercising to [employer] Introduction Exercising can help you to lose weight. In order to lose weight through exercise, you need to do vigorous-intensity exercise. You can tell that you are exercising with vigorous intensity if you are breathing very hard and fast and cannot hold a conversation while exercising. Moderate-intensity exercise helps to maintain your current weight. You can tell that you are exercising at a moderate level if you have a higher heart rate and faster breathing, but you are still able to hold a conversation. How often should I exercise? Choose an activity that you enjoy and set realistic goals. Your health care provider can help you to make an activity plan that works for you. Exercise regularly as directed by your health care provider. This may include:  Doing resistance training twice each week, such as:  Push-ups.  Sit-ups.  Lifting weights.  Using resistance bands.  Doing a given intensity of exercise for a given amount of time. Choose from these options:  150 minutes of moderate-intensity exercise every week.  75 minutes of  vigorous-intensity exercise every week.  A mix of moderate-intensity  and vigorous-intensity exercise every week. Children, pregnant women, people who are out of shape, people who are overweight, and older adults may need to consult a health care provider for individual recommendations. If you have any sort of medical condition, be sure to consult your health care provider before starting a new exercise program. What are some activities that can help me to lose weight?  Walking at a rate of at least 4.5 miles an hour.  Jogging or running at a rate of 5 miles per hour.  Biking at a rate of at least 10 miles per hour.  Lap swimming.  Roller-skating or in-line skating.  Cross-country skiing.  Vigorous competitive sports, such as football, basketball, and soccer.  Jumping rope.  Aerobic dancing. How can I be more active in my day-to-day activities?  Use the stairs instead of the elevator.  Take a walk during your lunch break.  If you drive, park your car farther away from work or school.  If you take public transportation, get off one stop early and walk the rest of the way.  Make all of your phone calls while standing up and walking around.  Get up, stretch, and walk around every 30 minutes throughout the day. What guidelines should I follow while exercising?  Do not exercise so much that you hurt yourself, feel dizzy, or get very short of breath.  Consult your health care provider prior to starting a new exercise program.  Wear comfortable clothes and shoes with good support.  Drink plenty of water while you exercise to prevent dehydration or heat stroke. Body water is lost during exercise and must be replaced.  Work out until you breathe faster and your heart beats faster. This information is not intended to replace advice given to you by your health care provider. Make sure you discuss any questions you have with your health care provider. Document Released: 09/10/2010  Document Revised: 01/14/2016 Document Reviewed: 01/09/2014  2017 Elsevier

## 2016-07-20 NOTE — Progress Notes (Signed)
Patient here for follow up. No concerns today. 

## 2016-07-20 NOTE — Progress Notes (Signed)
  Oncology Nurse Navigator Documentation Chaperoned pelvic exam. Wet prep sent to lab Navigator Location: [facility] (07/20/16 1100)   )Navigator Encounter Type: Follow-up Appt (07/20/16 1100)                 Multidisiplinary Clinic Type: GYN (07/20/16 1100)   Patient Visit Type: GynOnc (07/20/16 1100)                              Time Spent with Patient: 15 (07/20/16 1100)

## 2016-08-17 ENCOUNTER — Encounter: Payer: Self-pay | Admitting: Family Medicine

## 2016-08-17 ENCOUNTER — Ambulatory Visit (INDEPENDENT_AMBULATORY_CARE_PROVIDER_SITE_OTHER): Payer: BLUE CROSS/BLUE SHIELD | Admitting: Family Medicine

## 2016-08-17 VITALS — BP 139/98 | HR 79 | Temp 98.3°F | Wt 184.5 lb

## 2016-08-17 DIAGNOSIS — F411 Generalized anxiety disorder: Secondary | ICD-10-CM | POA: Diagnosis not present

## 2016-08-17 MED ORDER — ESCITALOPRAM OXALATE 10 MG PO TABS: 10.0000 mg | ORAL_TABLET | Freq: Every day | ORAL | 1 refills | Status: DC

## 2016-08-17 MED ORDER — HYDROXYZINE HCL 10 MG PO TABS: 10.0000 mg | ORAL_TABLET | Freq: Three times a day (TID) | ORAL | 1 refills | Status: DC | PRN

## 2016-08-17 NOTE — Assessment & Plan Note (Signed)
Doing much better. Will increase lexapro to 10 mg and check in in 1 month. Decrease hydroxyzine to 10mg  TID PRN. Call with any concerns.

## 2016-08-17 NOTE — Progress Notes (Signed)
BP (!) 139/98   Pulse 79   Temp 98.3 F (36.8 C)   Wt 184 lb 8 oz (83.7 kg)   LMP 05/07/2016 (Exact Date)   SpO2 97%   BMI 32.68 kg/m    Subjective:    Patient ID: Madison [name], female    DOB: 10-16-1980, 35 y.o.   MRN: [id number]  HPI: Madison [name] is a 35 y.o. female  Chief Complaint  Patient presents with  . Anxiety    1 month f/up   ANXIETY/STRESS- doing much better on the lexapro. Feeling well. Hydroxyzine is a little strong, would like lower dose. Otherwise doing well.  Duration:better Anxious mood: yes  Excessive worrying: yes Irritability: no  Sweating: no Nausea: no Palpitations:no Hyperventilation: no Panic attacks: no Agoraphobia: no  Obscessions/compulsions: no Depressed mood: no Depression screen [facility] 2/9 08/17/2016 07/11/2016  Decreased Interest 1 0  Down, Depressed, Hopeless 1 0  PHQ - 2 Score 2 0   GAD 7 : Generalized Anxiety Score 08/17/2016 07/11/2016  Nervous, Anxious, on Edge 1 2  Control/stop worrying 1 2  Worry too much - different things 1 2  Trouble relaxing 1 2  Restless 0 0  Easily annoyed or irritable 1 3  Afraid - awful might happen 1 2  Total GAD 7 Score 6 13  Anxiety Difficulty Somewhat difficult Very difficult   Anhedonia: no Weight changes: no Insomnia: no   Hypersomnia: no Fatigue/loss of energy: no Feelings of worthlessness: no Feelings of guilt: no Impaired concentration/indecisiveness: no Suicidal ideations: no  Crying spells: no Recent Stressors/Life Changes: yes  Relevant past medical, surgical, family and social history reviewed and updated as indicated. Interim medical history since our last visit reviewed. Allergies and medications reviewed and updated.  Review of Systems  Constitutional: Negative.   Respiratory: Negative.   Cardiovascular: Negative.   Psychiatric/Behavioral: Negative.     Per HPI unless specifically indicated above     Objective:    BP (!) 139/98   Pulse 79   Temp 98.3 F  (36.8 C)   Wt 184 lb 8 oz (83.7 kg)   LMP 05/07/2016 (Exact Date)   SpO2 97%   BMI 32.68 kg/m   Wt Readings from Last 3 Encounters:  08/17/16 184 lb 8 oz (83.7 kg)  07/20/16 183 lb 8.5 oz (83.3 kg)  07/11/16 185 lb 3.2 oz (84 kg)    Physical Exam  Constitutional: She is oriented to person, place, and time. She appears well-developed and well-nourished. No distress.  HENT:  Head: Normocephalic and atraumatic.  Right Ear: Hearing normal.  Left Ear: Hearing normal.  Nose: Nose normal.  Eyes: Conjunctivae and lids are normal. Right eye exhibits no discharge. Left eye exhibits no discharge. No scleral icterus.  Pulmonary/Chest: Effort normal. No respiratory distress.  Musculoskeletal: Normal range of motion.  Neurological: She is alert and oriented to person, place, and time.  Skin: Skin is warm, dry and intact. No rash noted. She is not diaphoretic. No erythema. No pallor.  Psychiatric: She has a normal mood and affect. Her speech is normal and behavior is normal. Judgment and thought content normal. Cognition and memory are normal.  Nursing note and vitals reviewed.   Results for orders placed or performed in visit on 07/20/16  Wet prep, genital  Result Value Ref Range   Yeast Wet Prep HPF POC NONE SEEN NONE SEEN   Trich, Wet Prep NONE SEEN NONE SEEN   Clue Cells Wet Prep HPF POC NONE  SEEN NONE SEEN   WBC, Wet Prep HPF POC MODERATE (A) NONE SEEN   Sperm NONE SEEN       Assessment & Plan:   Problem List Items Addressed This Visit      Other   Anxiety disorder - Primary    Doing much better. Will increase lexapro to 10 mg and check in in 1 month. Decrease hydroxyzine to 10mg  TID PRN. Call with any concerns.           Follow up plan: Return in about 4 weeks (around 09/14/2016) for Follow up anxiety.

## 2016-08-25 ENCOUNTER — Encounter: Payer: Self-pay | Admitting: Family Medicine

## 2016-08-25 ENCOUNTER — Ambulatory Visit: Payer: Self-pay | Admitting: Family Medicine

## 2016-08-25 ENCOUNTER — Ambulatory Visit (INDEPENDENT_AMBULATORY_CARE_PROVIDER_SITE_OTHER): Payer: BLUE CROSS/BLUE SHIELD | Admitting: Family Medicine

## 2016-08-25 VITALS — BP 122/85 | HR 70 | Temp 97.7°F | Wt 185.0 lb

## 2016-08-25 DIAGNOSIS — S39012A Strain of muscle, fascia and tendon of lower back, initial encounter: Secondary | ICD-10-CM

## 2016-08-25 MED ORDER — CYCLOBENZAPRINE HCL 10 MG PO TABS: 10.0000 mg | ORAL_TABLET | Freq: Three times a day (TID) | ORAL | 0 refills | Status: DC | PRN

## 2016-08-25 NOTE — Progress Notes (Signed)
   BP 122/85   Pulse 70   Temp 97.7 F (36.5 C)   Wt 185 lb (83.9 kg)   LMP 05/07/2016 (Exact Date)   SpO2 95%   BMI 32.77 kg/m    Subjective:    Patient ID: Madison [name], female    DOB: 10/29/1980, 36 y.o.   MRN: [id number]  HPI: Madison [name] is a 36 y.o. female  Chief Complaint  Patient presents with  . Back Pain    x 2 days. thinks she pulled a muscle in her back. Has been trying Ibuprofen and heating pad with no relief.    Patient presents with left lower back aching x 2 days. States it is worse with bending and twisting. No abnormal activities or injuries noted. Denies radiation, weakness, numbness, tingling, urinary symptoms, incontinence. Trying heat and ibuprofen with no relief. Has had this issue periodically following a bad fall about 10 years ago.   Relevant past medical, surgical, family and social history reviewed and updated as indicated. Interim medical history since our last visit reviewed. Allergies and medications reviewed and updated.  Review of Systems  Constitutional: Negative.   HENT: Negative.   Eyes: Negative.   Respiratory: Negative.   Cardiovascular: Negative.   Gastrointestinal: Negative.   Genitourinary: Negative.   Musculoskeletal: Positive for back pain.  Neurological: Negative.   Psychiatric/Behavioral: Negative.     Per HPI unless specifically indicated above     Objective:    BP 122/85   Pulse 70   Temp 97.7 F (36.5 C)   Wt 185 lb (83.9 kg)   LMP 05/07/2016 (Exact Date)   SpO2 95%   BMI 32.77 kg/m   Wt Readings from Last 3 Encounters:  08/25/16 185 lb (83.9 kg)  08/17/16 184 lb 8 oz (83.7 kg)  07/20/16 183 lb 8.5 oz (83.3 kg)    Physical Exam  Constitutional: She is oriented to person, place, and time. She appears well-developed and well-nourished.  HENT:  Head: Atraumatic.  Eyes: Conjunctivae are normal. Pupils are equal, round, and reactive to light.  Neck: Normal range of motion. Neck supple.  Cardiovascular:  Normal rate and normal heart sounds.   Pulmonary/Chest: Effort normal and breath sounds normal. No respiratory distress.  Musculoskeletal: Normal range of motion. She exhibits tenderness (TTP left lumbar paraspinal muscles).  Neurological: She is alert and oriented to person, place, and time.  Skin: Skin is warm and dry.  Psychiatric: She has a normal mood and affect. Her behavior is normal.      Assessment & Plan:   Problem List Items Addressed This Visit    None    Visit Diagnoses    Strain of lumbar region, initial encounter    -  Primary   Flexeril, ibuprofen, epsom salt soaks, stretches, massage. Discussed core exercises. Follow up if no improvement       Follow up plan: Return if symptoms worsen or fail to improve.

## 2016-08-25 NOTE — Patient Instructions (Signed)
Follow up as needed

## 2016-08-26 ENCOUNTER — Encounter: Payer: Self-pay | Admitting: Family Medicine

## 2016-08-26 MED ORDER — PREDNISONE 20 MG PO TABS: 40.0000 mg | ORAL_TABLET | Freq: Every day | ORAL | 0 refills | Status: DC

## 2016-09-16 ENCOUNTER — Encounter: Payer: Self-pay | Admitting: Family Medicine

## 2016-09-16 ENCOUNTER — Ambulatory Visit (INDEPENDENT_AMBULATORY_CARE_PROVIDER_SITE_OTHER): Payer: BLUE CROSS/BLUE SHIELD | Admitting: Family Medicine

## 2016-09-16 VITALS — BP 142/86 | HR 78 | Temp 97.5°F | Wt 183.5 lb

## 2016-09-16 DIAGNOSIS — F411 Generalized anxiety disorder: Secondary | ICD-10-CM | POA: Diagnosis not present

## 2016-09-16 MED ORDER — ESCITALOPRAM OXALATE 10 MG PO TABS
10.0000 mg | ORAL_TABLET | Freq: Every day | ORAL | 1 refills | Status: DC
Start: 2016-09-16 — End: 2017-01-17

## 2016-09-16 MED ORDER — HYDROXYZINE HCL 10 MG PO TABS: 10.0000 mg | ORAL_TABLET | Freq: Three times a day (TID) | ORAL | 1 refills | Status: DC | PRN

## 2016-09-16 NOTE — Assessment & Plan Note (Signed)
Under great control. Continue current regimen. Continue to monitor. Call with any concerns.  

## 2016-09-16 NOTE — Progress Notes (Signed)
BP (!) 142/86 (BP Location: Left Arm, Patient Position: Sitting, Cuff Size: Large)   Pulse 78   Temp 97.5 F (36.4 C)   Wt 183 lb 8 oz (83.2 kg)   LMP 05/07/2016 (Exact Date)   SpO2 98%   BMI 32.51 kg/m    Subjective:    Patient ID: Madison [name], female    DOB: Mar 09, 1981, 36 y.o.   MRN: [id number]  HPI: Madison [name] is a 36 y.o. female  Chief Complaint  Patient presents with  . Anxiety   ANXIETY/STRESS Duration:better Anxious mood: no  Excessive worrying: no Irritability: no  Sweating: no Nausea: no Palpitations:no Hyperventilation: no Panic attacks: no Agoraphobia: no  Obscessions/compulsions: no Depressed mood: no Depression screen [facility] 2/9 09/16/2016 08/17/2016 07/11/2016  Decreased Interest 0 1 0  Down, Depressed, Hopeless 0 1 0  PHQ - 2 Score 0 2 0  Altered sleeping 0 - -  Tired, decreased energy 0 - -  Change in appetite 0 - -  Feeling bad or failure about yourself  0 - -  Trouble concentrating 0 - -  Moving slowly or fidgety/restless 0 - -  Suicidal thoughts 0 - -  PHQ-9 Score 0 - -   GAD 7 : Generalized Anxiety Score 08/17/2016 07/11/2016  Nervous, Anxious, on Edge 1 2  Control/stop worrying 1 2  Worry too much - different things 1 2  Trouble relaxing 1 2  Restless 0 0  Easily annoyed or irritable 1 3  Afraid - awful might happen 1 2  Total GAD 7 Score 6 13  Anxiety Difficulty Somewhat difficult Very difficult   Anhedonia: no Weight changes: no Insomnia: no   Hypersomnia: no Fatigue/loss of energy: no Feelings of worthlessness: no Feelings of guilt: no Impaired concentration/indecisiveness: no Suicidal ideations: no  Crying spells: no Recent Stressors/Life Changes: no  Relevant past medical, surgical, family and social history reviewed and updated as indicated. Interim medical history since our last visit reviewed. Allergies and medications reviewed and updated.  Review of Systems  Constitutional: Negative.   Respiratory:  Negative.   Cardiovascular: Negative.   Psychiatric/Behavioral: Negative.     Per HPI unless specifically indicated above     Objective:    BP (!) 142/86 (BP Location: Left Arm, Patient Position: Sitting, Cuff Size: Large)   Pulse 78   Temp 97.5 F (36.4 C)   Wt 183 lb 8 oz (83.2 kg)   LMP 05/07/2016 (Exact Date)   SpO2 98%   BMI 32.51 kg/m   Wt Readings from Last 3 Encounters:  09/16/16 183 lb 8 oz (83.2 kg)  08/25/16 185 lb (83.9 kg)  08/17/16 184 lb 8 oz (83.7 kg)    Physical Exam  Constitutional: She is oriented to person, place, and time. She appears well-developed and well-nourished. No distress.  HENT:  Head: Normocephalic and atraumatic.  Right Ear: Hearing normal.  Left Ear: Hearing normal.  Nose: Nose normal.  Eyes: Conjunctivae and lids are normal. Right eye exhibits no discharge. Left eye exhibits no discharge. No scleral icterus.  Cardiovascular: Normal rate, regular rhythm, normal heart sounds and intact distal pulses.  Exam reveals no gallop.   No murmur heard. Pulmonary/Chest: Effort normal. No respiratory distress.  Musculoskeletal: Normal range of motion.  Neurological: She is alert and oriented to person, place, and time.  Skin: Skin is warm, dry and intact. No rash noted. No erythema. No pallor.  Psychiatric: She has a normal mood and affect. Her speech is normal  and behavior is normal. Judgment and thought content normal. Cognition and memory are normal.  Nursing note and vitals reviewed.   Results for orders placed or performed in visit on 07/20/16  Wet prep, genital  Result Value Ref Range   Yeast Wet Prep HPF POC NONE SEEN NONE SEEN   Trich, Wet Prep NONE SEEN NONE SEEN   Clue Cells Wet Prep HPF POC NONE SEEN NONE SEEN   WBC, Wet Prep HPF POC MODERATE (A) NONE SEEN   Sperm NONE SEEN       Assessment & Plan:   Problem List Items Addressed This Visit      Other   Anxiety disorder - Primary    Under great control. Continue current  regimen. Continue to monitor. Call with any concerns.           Follow up plan: Return in about 6 months (around 03/16/2017) for Follow up anxiety.

## 2016-10-19 ENCOUNTER — Inpatient Hospital Stay: Payer: BLUE CROSS/BLUE SHIELD

## 2016-11-09 ENCOUNTER — Inpatient Hospital Stay: Payer: BLUE CROSS/BLUE SHIELD | Attending: Obstetrics and Gynecology | Admitting: Obstetrics and Gynecology

## 2016-11-09 VITALS — BP 118/88 | HR 78 | Temp 96.4°F | Resp 18 | Ht 63.0 in | Wt 177.5 lb

## 2016-11-09 DIAGNOSIS — N809 Endometriosis, unspecified: Secondary | ICD-10-CM | POA: Diagnosis not present

## 2016-11-09 DIAGNOSIS — F419 Anxiety disorder, unspecified: Secondary | ICD-10-CM | POA: Diagnosis not present

## 2016-11-09 DIAGNOSIS — F329 Major depressive disorder, single episode, unspecified: Secondary | ICD-10-CM | POA: Diagnosis not present

## 2016-11-09 DIAGNOSIS — Z8541 Personal history of malignant neoplasm of cervix uteri: Secondary | ICD-10-CM | POA: Diagnosis not present

## 2016-11-09 DIAGNOSIS — Z79899 Other long term (current) drug therapy: Secondary | ICD-10-CM | POA: Insufficient documentation

## 2016-11-09 DIAGNOSIS — E669 Obesity, unspecified: Secondary | ICD-10-CM | POA: Diagnosis not present

## 2016-11-09 DIAGNOSIS — Z6831 Body mass index (BMI) 31.0-31.9, adult: Secondary | ICD-10-CM | POA: Insufficient documentation

## 2016-11-09 NOTE — Progress Notes (Signed)
Gynecologic Oncology Visit   Referring Provider: Dr. 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  Subjective:  Madison [name] is a 36 y.o. [phone] female who is seen in consultation from Dr. Prentice Docker for evaluation of invasive adenocarcinoma of uterine cervix. She presents for surveillance visit. She is doing well and has no complaints; specifically no vaginal discharge/bleeding, no bladder issues, normal intercourse.   She has lost almost ten pounds since her last visit,     Gynecologic Oncology FREDI [name] is a 36 y.o. [phone] female who is seen in consultation from Dr. Prentice Docker for evaluation of invasive adenocarcinoma of uterine cervix.   04/28/2016 CKC on revealed stage IB1 adenocarcinoma of the cervix.  DIAGNOSIS:  A. CERVIX; COLD KNIFE CONIZATION:  - INVASIVE ENDOCERVICAL ADENOCARCINOMA, USUAL TYPE.  - Moderately differentiated - LYMPH VASCULAR INVASION IS PRESENT.  - THE DEEP AND ECTOCERVICAL MARGINS ARE POSITIVE.   05/16/2016 PET/CT negative for metastatic disease.   She underwent robotic-assisted radical hysterectomy, bilateral salpingectomy, ureterolysis, bilateral sentinel lymph node mapping and biopsy, repair of serotomy on 06/09/2016.   Pathology: Invasive endocervical adenocarcinoma, involving 3:00 to 6:00 and 6:00 to 9:00 quadrants. Tumor invades 5/13 mm.  Mucinous Adenocarcinoma Subtype:Endocervical  Histologic Grade:G2: Moderately differentiated  Tumor Size:Greatest dimension (cm): 1.2 cm  Stromal Invasion:  Depth (mm):8 mm  Horizontal Extent (mm):9 mm  Lymph-Vascular Invasion:Not identified  MARGINS  Margins:  Margins:Uninvolved by invasive carcinoma  Distance of Invasive Carcinoma from Closest Margin:Specify (mm): 7 mm  Bilateral SLNs negative (0/3)   Paracervical endometriosis.  Bilateral fallopian  tubes: Negative for malignancy. Six lymph parametrial nodes negative for metastatic carcinoma. (0/6)  No further therapy recommended.   Problem List: Patient Active Problem List   Diagnosis Date Noted  . Vaginal discharge 07/20/2016  . Cervical cancer, FIGO stage IB1 ([place]) 07/20/2016  . Anxiety disorder 07/11/2016  . Malignant neoplasm of cervix ([place]) 05/11/2016  . Adenocarcinoma in situ (AIS) of uterine cervix 04/28/2016  . Status post cesarean section 12/08/2015    Past Medical History: Past Medical History:  Diagnosis Date  . Anxiety   . Cancer [facility]) 2017   Cervical  . Depression     Past Surgical History: Past Surgical History:  Procedure Laterality Date  . ABDOMINAL HYSTERECTOMY     Still has ovaries  . BREAST ENHANCEMENT SURGERY Bilateral   . BREAST SURGERY    . CERVICAL CONIZATION W/BX N/A 04/28/2016   Procedure: CONIZATION CERVIX WITH BIOPSY;  Surgeon: Will Bonnet, MD;  Location: [facility] ORS;  Service: Gynecology;  Laterality: N/A;  . CESAREAN SECTION    . CESAREAN SECTION WITH BILATERAL TUBAL LIGATION Bilateral 12/08/2015   Procedure: CESAREAN SECTION WITH BILATERAL TUBAL LIGATION;  Surgeon: Will Bonnet, MD;  Location: [facility] ORS;  Service: Obstetrics;  Laterality: Bilateral;  . DILATION AND CURETTAGE OF UTERUS      Past Gynecologic History:  Menarche: 12 Menstrual details: n/a Menses regular: n/a Last Menstrual Period: n/a s/p hysterectomy History of Abnormal pap: See HPI Contraception: bilateral tubal ligation Sexually active: yes  OB History:  OB History  Gravida Para Term Preterm AB Living  7 4 4   3 4   SAB TAB Ectopic Multiple Live Births  3     0 4    # Outcome Date GA Lbr Len/2nd Weight Sex Delivery Anes PTL Lv  7 Term 12/08/15 [redacted]w[redacted]d  9 lb 3.8 oz (4.19 kg) M CS-Vac Spinal  LIV  6 SAB 09/2014          5 Term 01/28/13     CS-LTranv   LIV  4 SAB 07/2011          3 Term 12/15/10     CS-LTranv   LIV  2 SAB 04/2008          1 Term 07/13/98      Vag-Spont   LIV      Family History: Family History  Problem Relation Age of Onset  . Diabetes Mellitus II Father   . Lung cancer Maternal Grandmother   . Ovarian cancer Paternal Grandmother     Social History: Social History   Social History  . Marital status: Married    Spouse name: N/A  . Number of children: N/A  . Years of education: N/A   Occupational History  . Not on file.   Social History Main Topics  . Smoking status: Never Smoker  . Smokeless tobacco: Never Used  . Alcohol use No  . Drug use: No  . Sexual activity: Yes   Other Topics Concern  . Not on file   Social History Narrative  . No narrative on file    Allergies: Allergies  Allergen Reactions  . Tape Other (See Comments)    Burns skin    Current Medications: Current Outpatient Prescriptions  Medication Sig Dispense Refill  . cyclobenzaprine (FLEXERIL) 10 MG tablet Take 1 tablet (10 mg total) by mouth 3 (three) times daily as needed for muscle spasms. 45 tablet 0  . escitalopram (LEXAPRO) 10 MG tablet Take 1 tablet (10 mg total) by mouth daily. 1/2 tab for first week, then increase to 1 tab daily 90 tablet 1  . hydrOXYzine (ATARAX/VISTARIL) 10 MG tablet Take 1 tablet (10 mg total) by mouth 3 (three) times daily as needed. 270 tablet 1   No current facility-administered medications for this visit.     Review of Systems General: no complaints  HEENT: no complaints  Lungs: no complaints  Cardiac: no complaints  GI: no complaints  GU: no complaints  Musculoskeletal: no complaints  Extremities: no complaints  Skin: no complaints  Neuro: no complaints  Endocrine: no complaints  Psych: no complaints       Objective:  Physical Examination:  BP 118/88   Pulse 78   Temp (!) 96.4 F (35.8 C) (Tympanic)   Resp 18   Ht 5\' 3"  (1.6 m)   Wt 177 lb 8 oz (80.5 kg)   LMP 05/07/2016 (Exact Date)   BMI 31.44 kg/m     ECOG Performance Status: 0 - Asymptomatic  General appearance:  alert, cooperative and appears stated age HEENT:PERRLA, extra ocular movement intact and sclera clear, anicteric Abdomen: soft, non-tender, without masses or organomegaly, nondistended, no hernias and well healed incisions CV: RRR Lungs: BCTA Extremities: extremities normal, atraumatic, no cyanosis or edema Skin exam - normal coloration and turgor, no rashes, no suspicious skin lesions noted. Neurological exam reveals alert, oriented, normal speech, no focal findings or movement disorder noted.  Pelvic: exam chaperoned by nurse;  Vulva: normal appearing vulva with no masses, tenderness or lesions; Vagina: normal vagina, vaginal cuff healed, no discharge; Adnexa: nontender and no masses; Uterus/Cervix: surgically absent. BME no masses or nodularity. RV confirmatory.   Lab Review n/a  Radiologic Imaging: n/a    Assessment:  Madison [name] is a 36 y.o. female diagnosed with probably stage IB1cervical cancer, adenocarcinoma, grade 2, s/p radical hysterectomy, bilateral SLN negative margins, negative  parameteria and negative LVSI.   Medical co-morbidities complicating care: obesity and prior abdominal surgery. Body mass index is 31.44 kg/m.  Plan:   Problem List Items Addressed This Visit    None    Visit Diagnoses    History of cervical cancer    -  Primary   Endometriosis          No evidence of disease; close follow up is warranted. We will begin alternating exams with Dr. Glennon Mac. Plan for vaginal cytology in 05/2016, as discussed below thought to be of limited value but annual evaluation remains a surveillance option.   I congratulated her on her weight loss and we will continue to follow.   Survivorship plan discussed at her postoperative visit as noted below. If NED I have recommended continued close follow up with exams, including pelvic exams every 3-6 months for 2 years, then every 6-12 months for 3-5 years and then annually thereafter. Cervical/vaginal cytology may be  considered annually, but is thought to have limited value for detecting recurrent cervical cancer. Imaging and laboratory assessment is based on clinical indication. Patient education for obesity, lifestyle, exercise, nutrition,sexual health, vaginal dilator use, vaginal lubricants/moisturizers.    The patient's diagnosis, an outline of the further diagnostic and laboratory studies which will be required, the recommendation, and alternatives were discussed.  All questions were answered to the patient's satisfaction.    Gillis Ends, MD    CC:  Dr. Prentice Docker

## 2016-11-09 NOTE — Progress Notes (Signed)
Pt with no GYN complaints

## 2016-11-09 NOTE — Progress Notes (Signed)
  Oncology Nurse Navigator Documentation Chaperoned pelvic exam. Follow up in 3 months with Dr. Glennon Mac and 6 months with Dr. Theora Gianotti Navigator Location: [facility] (11/09/16 0900)   )Navigator Encounter Type: Follow-up Appt (11/09/16 0900)                                                    Time Spent with Patient: 15 (11/09/16 0900)

## 2016-11-09 NOTE — Patient Instructions (Signed)
Please contact Dr. Marisue [place] office to make your follow up appointment with him in 3 months.

## 2017-01-09 IMAGING — CT NM PET TUM IMG INITIAL (PI) SKULL BASE T - THIGH
10 series · 25 of 25 positions shown · non-contrast
Comparison: None.

CLINICAL DATA: Initial treatment strategy for cervical
adenocarcinoma.

EXAM:
NUCLEAR MEDICINE PET SKULL BASE TO THIGH
TECHNIQUE: 12.8 mCi F-18 FDG was injected intravenously. Full-ring PET imaging
was performed from the skull base to thigh after the radiotracer. CT
data was obtained and used for attenuation correction and anatomic
localization.
FASTING BLOOD GLUCOSE:  Value: 84 mg/dl

[Series 3: ct wb 5.0 b30f · axial · 5.0mm · 0.98mm/px · z∈[-1437,-570]mm · 3 of 290 slices shown]
[im 1/290]
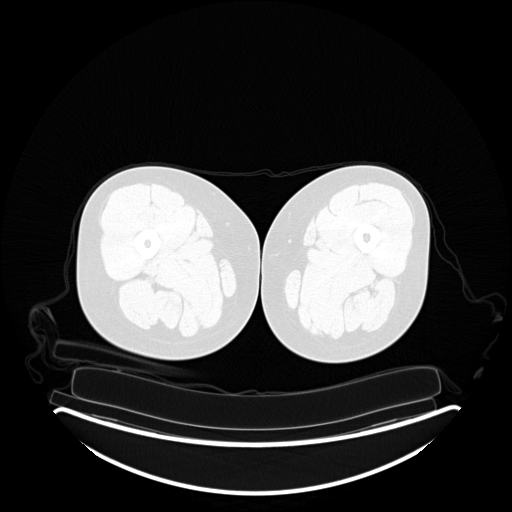
[im 145/290]
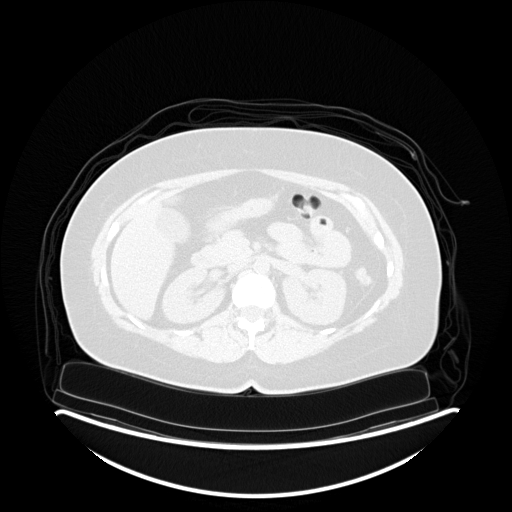
[im 290/290  brain]
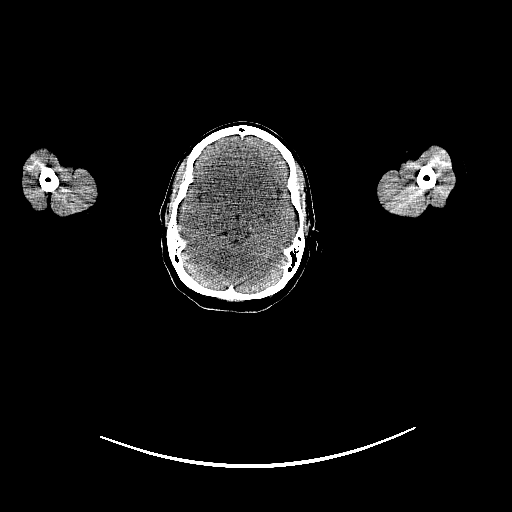

[Series 4: pet wb (ac) · axial · 5.0mm · 4.07mm/px · z∈[-1437,-570]mm · 3 of 290 slices shown]
[im 1/290]
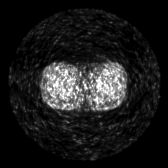
[im 145/290]
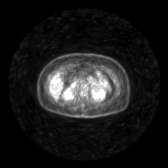
[im 290/290]
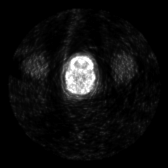

[Series 5: pet wb uncorrected (nac) · axial · 5.0mm · 4.07mm/px · z∈[-1437,-570]mm · 4 of 290 slices shown]
[im 1/290]
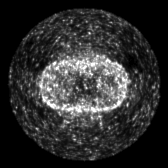
[im 97/290]
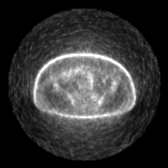
[im 193/290]
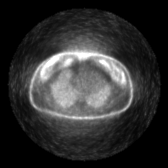
[im 290/290]
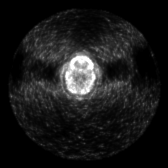

[Series 603: pet/ct axial fused · 4 of 288 slices shown]
[im 1/288]
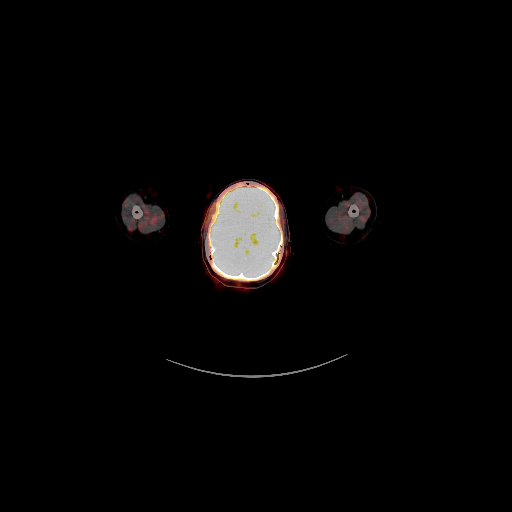
[im 96/288]
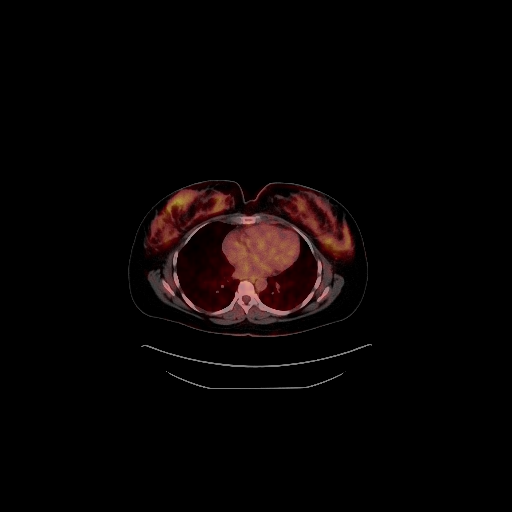
[im 192/288]
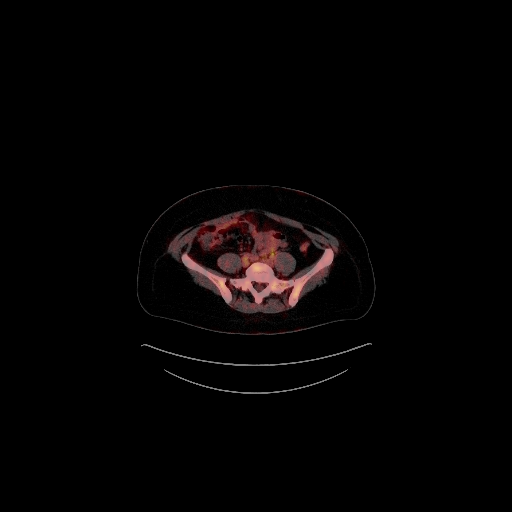
[im 288/288]
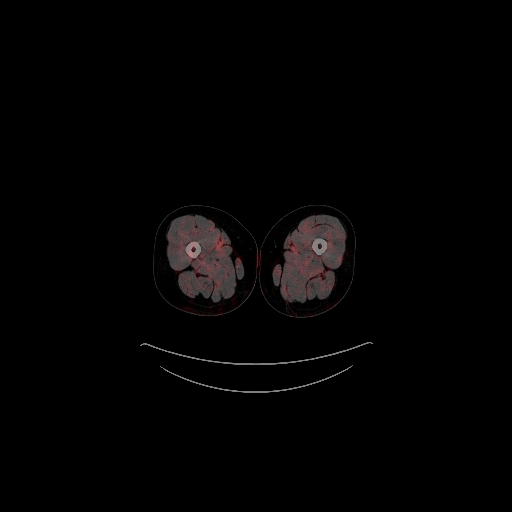

[Series 604: pet/ct coronal fused · 1 of 79 slices shown]
[im 1/79]
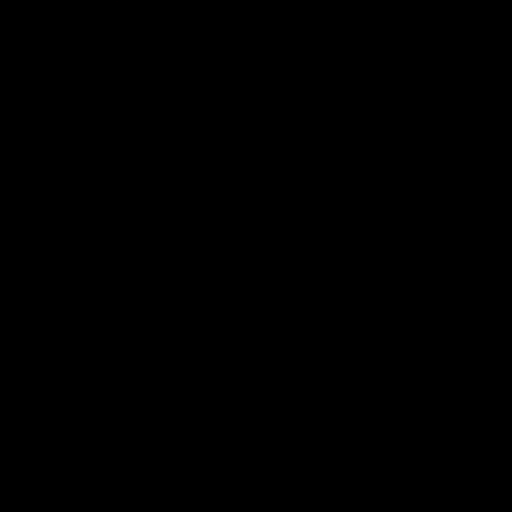

[Series 605: pet/ct sagittal fused · 2 of 149 slices shown]
[im 1/149]
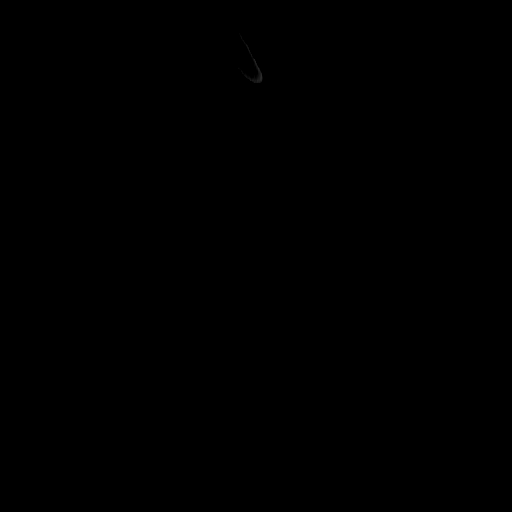
[im 149/149]
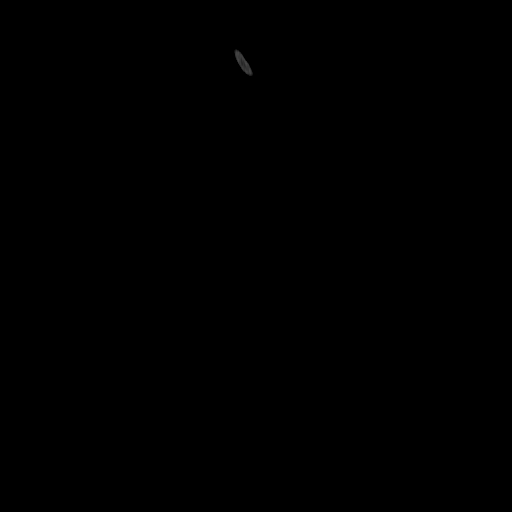

[Series 606: pet axial · 4 of 289 slices shown]
[im 1/289]
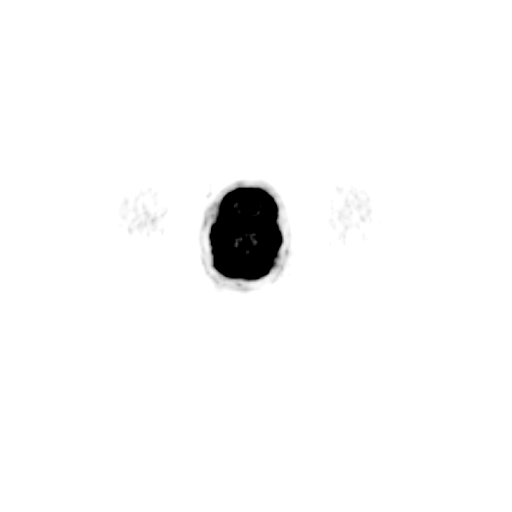
[im 97/289]
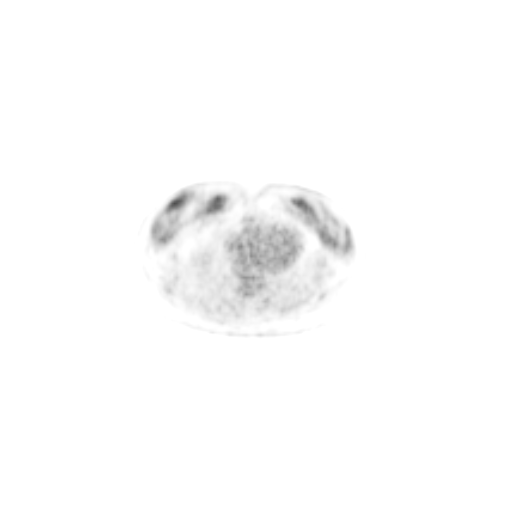
[im 193/289]
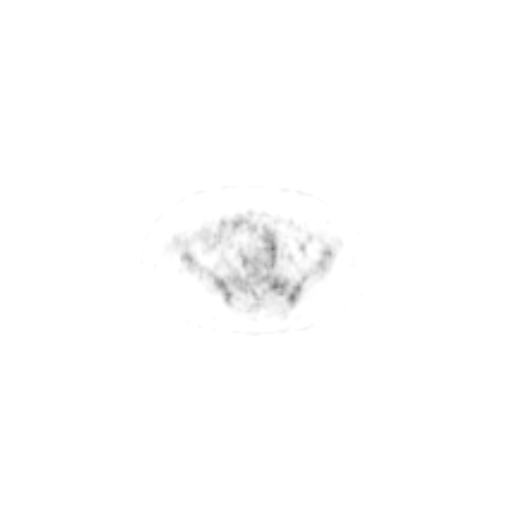
[im 289/289]
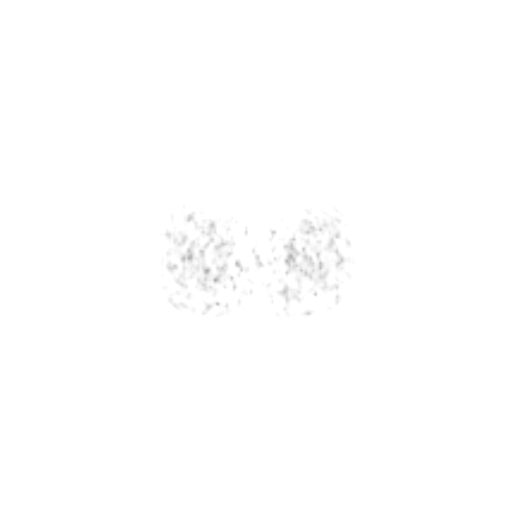

[Series 607: pet coronal · 1 of 92 slices shown]
[im 1/92]
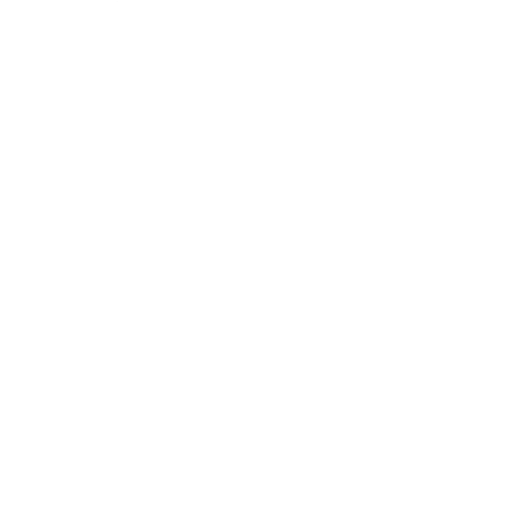

[Series 608: pet sagittal · 2 of 153 slices shown]
[im 1/153]
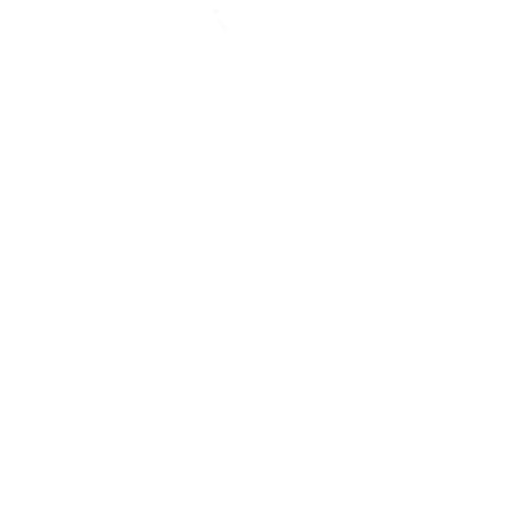
[im 153/153]
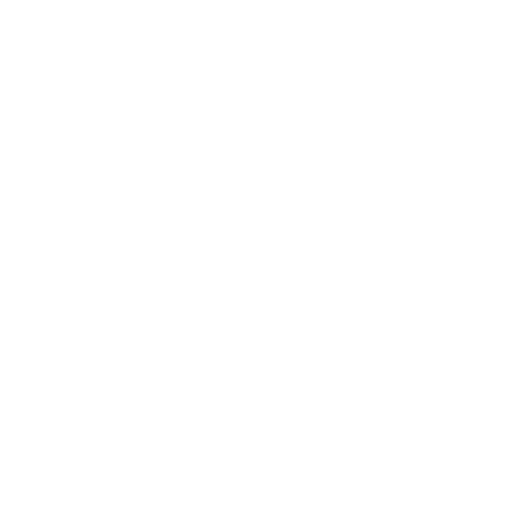

[Series 1031: results mm oncology reading · 0.90mm/px · 1 of 1 slices shown]
[im 1/1]
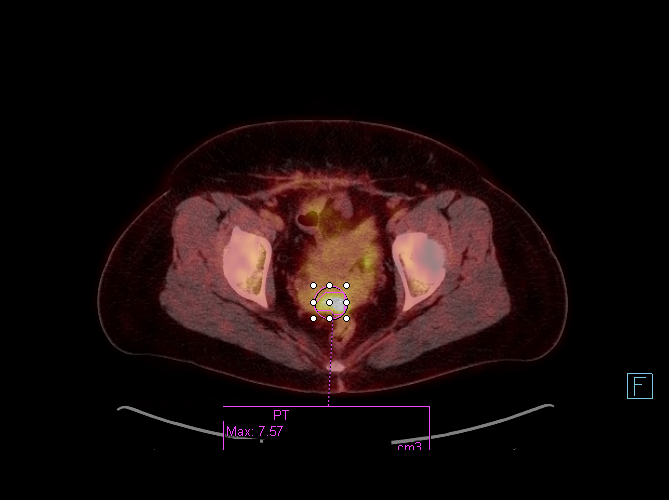

[25 of 25 positions shown; findings below may reference images not displayed]

FINDINGS: NECK

No hypermetabolic lymph nodes in the neck.

CHEST

No hypermetabolic mediastinal or hilar nodes. No suspicious
pulmonary nodules on the CT scan.

ABDOMEN/PELVIS

No abnormal hypermetabolic activity within the liver, pancreas,
adrenal glands, or spleen. No hypermetabolic lymph nodes in the
abdomen or pelvis.

A focal area of hypermetabolism is seen in the region the cervix
with SUV max of 7.6, consistent with known primary cervical
carcinoma.

Several tiny nonobstructive right renal calculi are seen. Left upper
pole renal cyst also noted without FDG activity.

SKELETON

No focal hypermetabolic activity to suggest skeletal metastasis.
Benign hemangioma incidentally noted in L1 vertebral body.
IMPRESSION: Focal hypermetabolism in region of the uterine cervix, consistent
with known primary cervical carcinoma.

No evidence of pelvic lymph node or distant metastatic disease.

## 2017-01-15 ENCOUNTER — Encounter: Payer: Self-pay | Admitting: Family Medicine

## 2017-01-17 MED ORDER — ESCITALOPRAM OXALATE 10 MG PO TABS: 10.0000 mg | ORAL_TABLET | Freq: Every day | ORAL | 1 refills | Status: DC

## 2017-01-31 ENCOUNTER — Encounter: Payer: Self-pay | Admitting: Unknown Physician Specialty

## 2017-01-31 ENCOUNTER — Ambulatory Visit (INDEPENDENT_AMBULATORY_CARE_PROVIDER_SITE_OTHER): Payer: BLUE CROSS/BLUE SHIELD | Admitting: Unknown Physician Specialty

## 2017-01-31 VITALS — BP 122/85 | HR 102 | Temp 99.9°F | Wt 188.2 lb

## 2017-01-31 DIAGNOSIS — J029 Acute pharyngitis, unspecified: Secondary | ICD-10-CM

## 2017-01-31 LAB — RAPID STREP SCREEN (MED CTR MEBANE ONLY): Strep Gp A Ag, IA W/Reflex: POSITIVE — AB

## 2017-01-31 MED ORDER — AMOXICILLIN 875 MG PO TABS: 875.0000 mg | ORAL_TABLET | Freq: Two times a day (BID) | ORAL | 0 refills | Status: DC

## 2017-01-31 NOTE — Progress Notes (Signed)
   BP 122/85   Pulse (!) 102   Temp 99.9 F (37.7 C)   Wt 188 lb 3.2 oz (85.4 kg)   LMP 05/07/2016 (Exact Date)   SpO2 98%   BMI 33.34 kg/m    Subjective:    Patient ID: Madison [name], female    DOB: 19-Nov-1980, 36 y.o.   MRN: [id number]  HPI: Madison [name] is a 36 y.o. female  Chief Complaint  Patient presents with  . Sore Throat    pt states she has had an extremely sore throat and body aches since last night   Sore Throat   This is a new problem. Episode onset: last night. The problem has been gradually worsening. Neither side of throat is experiencing more pain than the other. There has been no fever. The pain is severe. Associated symptoms include ear pain. Associated symptoms comments: myalgias. She has had no exposure to strep. She has tried nothing for the symptoms.    Relevant past medical, surgical, family and social history reviewed and updated as indicated. Interim medical history since our last visit reviewed. Allergies and medications reviewed and updated.  Review of Systems  HENT: Positive for ear pain.     Per HPI unless specifically indicated above     Objective:    BP 122/85   Pulse (!) 102   Temp 99.9 F (37.7 C)   Wt 188 lb 3.2 oz (85.4 kg)   LMP 05/07/2016 (Exact Date)   SpO2 98%   BMI 33.34 kg/m   Wt Readings from Last 3 Encounters:  01/31/17 188 lb 3.2 oz (85.4 kg)  11/09/16 177 lb 8 oz (80.5 kg)  09/16/16 183 lb 8 oz (83.2 kg)    Physical Exam  Constitutional: She is oriented to person, place, and time. She appears well-developed and well-nourished. No distress.  HENT:  Head: Normocephalic and atraumatic.  Right Ear: Tympanic membrane and ear canal normal.  Left Ear: Tympanic membrane and ear canal normal.  Nose: Rhinorrhea present. Right sinus exhibits no maxillary sinus tenderness and no frontal sinus tenderness. Left sinus exhibits no maxillary sinus tenderness and no frontal sinus tenderness.  Mouth/Throat: Mucous membranes are  normal. Posterior oropharyngeal edema and posterior oropharyngeal erythema present.  Eyes: Conjunctivae and lids are normal. Right eye exhibits no discharge. Left eye exhibits no discharge. No scleral icterus.  Cardiovascular: Normal rate and regular rhythm.   Pulmonary/Chest: Effort normal and breath sounds normal. No respiratory distress.  Abdominal: Normal appearance. There is no splenomegaly or hepatomegaly.  Musculoskeletal: Normal range of motion.  Neurological: She is alert and oriented to person, place, and time.  Skin: Skin is intact. No rash noted. No pallor.  Psychiatric: She has a normal mood and affect. Her behavior is normal. Judgment and thought content normal.     Assessment & Plan:   Problem List Items Addressed This Visit    None    Visit Diagnoses    Sore throat    -  Primary   Positive strep.  Rx for Amoxil.     Relevant Orders   Rapid strep screen (not at [facility])       Follow up plan: Return if symptoms worsen or fail to improve.

## 2017-03-16 ENCOUNTER — Ambulatory Visit: Payer: BLUE CROSS/BLUE SHIELD | Admitting: Family Medicine

## 2017-04-21 ENCOUNTER — Encounter: Payer: Self-pay | Admitting: Obstetrics and Gynecology

## 2017-04-21 ENCOUNTER — Ambulatory Visit (INDEPENDENT_AMBULATORY_CARE_PROVIDER_SITE_OTHER): Payer: BLUE CROSS/BLUE SHIELD | Admitting: Obstetrics and Gynecology

## 2017-04-21 VITALS — BP 122/74 | Ht 62.0 in | Wt 192.0 lb

## 2017-04-21 DIAGNOSIS — Z1339 Encounter for screening examination for other mental health and behavioral disorders: Secondary | ICD-10-CM

## 2017-04-21 DIAGNOSIS — Z01419 Encounter for gynecological examination (general) (routine) without abnormal findings: Secondary | ICD-10-CM

## 2017-04-21 DIAGNOSIS — C539 Malignant neoplasm of cervix uteri, unspecified: Secondary | ICD-10-CM | POA: Diagnosis not present

## 2017-04-21 DIAGNOSIS — Z1389 Encounter for screening for other disorder: Secondary | ICD-10-CM | POA: Diagnosis not present

## 2017-04-21 DIAGNOSIS — Z1331 Encounter for screening for depression: Secondary | ICD-10-CM

## 2017-04-21 NOTE — Progress Notes (Signed)
Gynecology Annual Exam  PCP: Valerie Roys, DO  Chief Complaint  Patient presents with  . Annual Exam  surveillance exam for cervical cancer  History of Present Illness:  [name] is a 36 y.o. [id number] who LMP was Patient's last menstrual period was 05/07/2016 (exact date)., presents today for her annual examination.  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  She underwent a radical hysterectomy, bilateral salpingectomy.  Review at [place] tumor board resulted in the recommendation for no further treatment (no radiation or chemotherapy).  She is being monitored by Dr. Theora Gianotti from [place], alternative surveillance visits with me. Today she specifically denies, vaginal bleeding, lower abdominal pain, lateral back pain, bloating, early satiety, cough, trouble breathing, headaches, unintended weight loss.   She is single partner, contraception - s/p hysterectomy.  Hx of STDs: none  Last mammogram: n/a based on age There is no FH of breast cancer. There is no FH of ovarian cancer. The patient does not do self-breast exams.  Tobacco use: The patient denies current or previous tobacco use. Alcohol use: none Exercise: not active  The patient wears seatbelts: yes.      Past Medical History:  Diagnosis Date  . Anxiety   . Cancer [facility]) 2017   Cervical  . Depression     Past Surgical History:  Procedure Laterality Date  . BREAST ENHANCEMENT SURGERY Bilateral   . BREAST SURGERY    . CERVICAL CONIZATION W/BX N/A 04/28/2016   Procedure: CONIZATION CERVIX WITH BIOPSY;  Surgeon: Will Bonnet, MD;  Location: [facility] ORS;  Service: Gynecology;  Laterality: N/A;  . CESAREAN SECTION    . CESAREAN SECTION WITH BILATERAL TUBAL LIGATION Bilateral 12/08/2015   Procedure: CESAREAN SECTION WITH BILATERAL TUBAL LIGATION;  Surgeon: Will Bonnet, MD;  Location: [facility] ORS;  Service: Obstetrics;  Laterality: Bilateral;  .  DILATION AND CURETTAGE OF UTERUS    . ROBOTIC ASSISTED LAPAROSCOPIC HYSTERECTOMY AND SALPINGECTOMY  2017    Medications: denies    Allergies  Allergen Reactions  . Tape Other (See Comments)    Burns skin    Gynecologic History: Patient's last menstrual period was 05/07/2016 (exact date).  Obstetric History: [id number]  Social History   Social History  . Marital status: Married    Spouse name: N/A  . Number of children: N/A  . Years of education: N/A   Occupational History  . Not on file.   Social History Main Topics  . Smoking status: Never Smoker  . Smokeless tobacco: Never Used  . Alcohol use No  . Drug use: No  . Sexual activity: Yes   Other Topics Concern  . Not on file   Social History Narrative  . No narrative on file    Family History  Problem Relation Age of Onset  . Diabetes Mellitus II Father   . Lung cancer Maternal Grandmother   . Ovarian cancer Paternal Grandmother     Review of Systems  Constitutional: Negative.   HENT: Negative.   Eyes: Negative.   Respiratory: Negative.   Cardiovascular: Negative.   Gastrointestinal: Negative.   Genitourinary: Negative.   Musculoskeletal: Negative.   Skin: Negative.   Neurological: Negative.   Psychiatric/Behavioral: Negative.      Physical Exam BP 122/74   Ht 5\' 2"  (1.575 m)   Wt 192 lb (87.1 kg)   LMP 05/07/2016 (Exact Date)   BMI 35.12 kg/m    OBGyn Exam  Female chaperone present for pelvic and breast  portions of the physical exam  Results: AUDIT Questionnaire (screen for alcoholism): 0 PHQ-9: 1   Assessment: 36 y.o. [id number] female here for routine annual gynecologic examination.  Plan: Problem List Items Addressed This Visit    Cervical cancer, FIGO stage IB1 ([place])    Other Visit Diagnoses    Women's annual routine gynecological examination    -  Primary   Screening for depression       Screening for alcohol problem         Screening: -- Blood pressure screen normal --  Colonoscopy - not due -- Mammogram - not due -- Weight screening: obese: discussed management options, including lifestyle, dietary, and exercise. -- Depression screening negative (PHQ-9) -- Nutrition: normal -- cholesterol screening: not due for screening -- osteoporosis screening: not due -- tobacco screening: not using -- alcohol screening: AUDIT questionnaire indicates low-risk usage. -- family history of breast cancer screening: done. not at high risk. -- no evidence of domestic violence or intimate partner violence. -- STD screening: gonorrhea/chlamydia NAAT not collected per patient request. -- HPV vaccination series: not eligilbe  -- Cervical Cancer: If Dr. Theora Gianotti recommends pap smears, I will be happy to perform them, if she does not.  I will also follow her lead in terms of frequency of surveillance visits as there is a range of frequencies that can be utilized, according to NCCN.  Patient states that she has her next appointment with Dr. Theora Gianotti in about 1 month.  No symptoms suggestive of recurrence today.   Prentice Docker, MD 04/21/2017 1:25 PM

## 2017-05-10 ENCOUNTER — Inpatient Hospital Stay: Payer: BLUE CROSS/BLUE SHIELD | Attending: Obstetrics and Gynecology | Admitting: Obstetrics and Gynecology

## 2017-05-10 VITALS — BP 117/79 | HR 81 | Temp 97.5°F | Resp 18 | Ht 62.0 in | Wt 193.2 lb

## 2017-05-10 DIAGNOSIS — Z90722 Acquired absence of ovaries, bilateral: Secondary | ICD-10-CM | POA: Diagnosis not present

## 2017-05-10 DIAGNOSIS — Z6835 Body mass index (BMI) 35.0-35.9, adult: Secondary | ICD-10-CM | POA: Diagnosis not present

## 2017-05-10 DIAGNOSIS — E669 Obesity, unspecified: Secondary | ICD-10-CM | POA: Diagnosis not present

## 2017-05-10 DIAGNOSIS — C539 Malignant neoplasm of cervix uteri, unspecified: Secondary | ICD-10-CM | POA: Insufficient documentation

## 2017-05-10 DIAGNOSIS — Z90710 Acquired absence of both cervix and uterus: Secondary | ICD-10-CM | POA: Insufficient documentation

## 2017-05-10 DIAGNOSIS — Z8541 Personal history of malignant neoplasm of cervix uteri: Secondary | ICD-10-CM | POA: Diagnosis not present

## 2017-05-10 NOTE — Patient Instructions (Signed)
Calorie Counting for Weight Loss Calories are units of energy. Your body needs a certain amount of calories from food to keep you going throughout the day. When you eat more calories than your body needs, your body stores the extra calories as fat. When you eat fewer calories than your body needs, your body burns fat to get the energy it needs. Calorie counting means keeping track of how many calories you eat and drink each day. Calorie counting can be helpful if you need to lose weight. If you make sure to eat fewer calories than your body needs, you should lose weight. Ask your health care provider what a healthy weight is for you. For calorie counting to work, you will need to eat the right number of calories in a day in order to lose a healthy amount of weight per week. A dietitian can help you determine how many calories you need in a day and will give you suggestions on how to reach your calorie goal.  A healthy amount of weight to lose per week is usually 1-2 lb (0.5-0.9 kg). This usually means that your daily calorie intake should be reduced by 500-750 calories.  Eating 1,200 - 1,500 calories per day can help most women lose weight.  Eating 1,500 - 1,800 calories per day can help most men lose weight.    What do I need to know about calorie counting? In order to meet your daily calorie goal, you will need to:  Find out how many calories are in each food you would like to eat. Try to do this before you eat.  Decide how much of the food you plan to eat.  Write down what you ate and how many calories it had. Doing this is called keeping a food log.  To successfully lose weight, it is important to balance calorie counting with a healthy lifestyle that includes regular activity. Aim for 150 minutes of moderate exercise (such as walking) or 75 minutes of vigorous exercise (such as running) each week. Where do I find calorie information?  The number of calories in a food can be found on a  Nutrition Facts label. If a food does not have a Nutrition Facts label, try to look up the calories online or ask your dietitian for help. Remember that calories are listed per serving. If you choose to have more than one serving of a food, you will have to multiply the calories per serving by the amount of servings you plan to eat. For example, the label on a package of bread might say that a serving size is 1 slice and that there are 90 calories in a serving. If you eat 1 slice, you will have eaten 90 calories. If you eat 2 slices, you will have eaten 180 calories. How do I keep a food log? Immediately after each meal, record the following information in your food log:  What you ate. Don't forget to include toppings, sauces, and other extras on the food.  How much you ate. This can be measured in cups, ounces, or number of items.  How many calories each food and drink had.  The total number of calories in the meal.  Keep your food log near you, such as in a small notebook in your pocket, or use a mobile app or website. Some programs will calculate calories for you and show you how many calories you have left for the day to meet your goal. What are some calorie counting  tips?  Use your calories on foods and drinks that will fill you up and not leave you hungry: ? Some examples of foods that fill you up are nuts and nut butters, vegetables, lean proteins, and high-fiber foods like whole grains. High-fiber foods are foods with more than 5 g fiber per serving. ? Drinks such as sodas, specialty coffee drinks, alcohol, and juices have a lot of calories, yet do not fill you up.  Eat nutritious foods and avoid empty calories. Empty calories are calories you get from foods or beverages that do not have many vitamins or protein, such as candy, sweets, and soda. It is better to have a nutritious high-calorie food (such as an avocado) than a food with few nutrients (such as a bag of chips).  Know how  many calories are in the foods you eat most often. This will help you calculate calorie counts faster.  Pay attention to calories in drinks. Low-calorie drinks include water and unsweetened drinks.  Pay attention to nutrition labels for "low fat" or "fat free" foods. These foods sometimes have the same amount of calories or more calories than the full fat versions. They also often have added sugar, starch, or salt, to make up for flavor that was removed with the fat.  Find a way of tracking calories that works for you. Get creative. Try different apps or programs if writing down calories does not work for you. What are some portion control tips?  Know how many calories are in a serving. This will help you know how many servings of a certain food you can have.  Use a measuring cup to measure serving sizes. You could also try weighing out portions on a kitchen scale. With time, you will be able to estimate serving sizes for some foods.  Take some time to put servings of different foods on your favorite plates, bowls, and cups so you know what a serving looks like.  Try not to eat straight from a bag or box. Doing this can lead to overeating. Put the amount you would like to eat in a cup or on a plate to make sure you are eating the right portion.  Use smaller plates, glasses, and bowls to prevent overeating.  Try not to multitask (for example, watch TV or use your computer) while eating. If it is time to eat, sit down at a table and enjoy your food. This will help you to know when you are full. It will also help you to be aware of what you are eating and how much you are eating. What are tips for following this plan? Reading food labels  Check the calorie count compared to the serving size. The serving size may be smaller than what you are used to eating.  Check the source of the calories. Make sure the food you are eating is high in vitamins and protein and low in saturated and trans  fats. Shopping  Read nutrition labels while you shop. This will help you make healthy decisions before you decide to purchase your food.  Make a grocery list and stick to it. Cooking  Try to cook your favorite foods in a healthier way. For example, try baking instead of frying.  Use low-fat dairy products. Meal planning  Use more fruits and vegetables. Half of your plate should be fruits and vegetables.  Include lean proteins like poultry and fish. How do I count calories when eating out?  Ask for smaller portion sizes.  Consider sharing an entree and sides instead of getting your own entree.  If you get your own entree, eat only half. Ask for a box at the beginning of your meal and put the rest of your entree in it so you are not tempted to eat it.  If calories are listed on the menu, choose the lower calorie options.  Choose dishes that include vegetables, fruits, whole grains, low-fat dairy products, and lean protein.  Choose items that are boiled, broiled, grilled, or steamed. Stay away from items that are buttered, battered, fried, or served with cream sauce. Items labeled "crispy" are usually fried, unless stated otherwise.  Choose water, low-fat milk, unsweetened iced tea, or other drinks without added sugar. If you want an alcoholic beverage, choose a lower calorie option such as a glass of wine or light beer.  Ask for dressings, sauces, and syrups on the side. These are usually high in calories, so you should limit the amount you eat.  If you want a salad, choose a garden salad and ask for grilled meats. Avoid extra toppings like bacon, cheese, or fried items. Ask for the dressing on the side, or ask for olive oil and vinegar or lemon to use as dressing.  Estimate how many servings of a food you are given. For example, a serving of cooked rice is  cup or about the size of half a baseball. Knowing serving sizes will help you be aware of how much food you are eating at  restaurants. The list below tells you how big or small some common portion sizes are based on everyday objects: ? 1 oz-4 stacked dice. ? 3 oz-1 deck of cards. ? 1 tsp-1 die. ? 1 Tbsp- a ping-pong ball. ? 2 Tbsp-1 ping-pong ball. ?  cup- baseball. ? 1 cup-1 baseball. Summary  Calorie counting means keeping track of how many calories you eat and drink each day. If you eat fewer calories than your body needs, you should lose weight.  A healthy amount of weight to lose per week is usually 1-2 lb (0.5-0.9 kg). This usually means reducing your daily calorie intake by 500-750 calories.  The number of calories in a food can be found on a Nutrition Facts label. If a food does not have a Nutrition Facts label, try to look up the calories online or ask your dietitian for help.  Use your calories on foods and drinks that will fill you up, and not on foods and drinks that will leave you hungry.  Use smaller plates, glasses, and bowls to prevent overeating. This information is not intended to replace advice given to you by your health care provider. Make sure you discuss any questions you have with your health care provider. Document Released: 08/08/2005 Document Revised: 07/08/2016 Document Reviewed: 07/08/2016 Elsevier Interactive Patient Education  2017 [employer].   Exercising to [employer] Exercising can help you to lose weight. In order to lose weight through exercise, you need to do vigorous-intensity exercise. You can tell that you are exercising with vigorous intensity if you are breathing very hard and fast and cannot hold a conversation while exercising. Moderate-intensity exercise helps to maintain your current weight. You can tell that you are exercising at a moderate level if you have a higher heart rate and faster breathing, but you are still able to hold a conversation. How often should I exercise? Choose an activity that you enjoy and set realistic goals. Your health care  provider can help you to  make an activity plan that works for you. Exercise regularly as directed by your health care provider. This may include:  Doing resistance training twice each week, such as: ? Push-ups. ? Sit-ups. ? Lifting weights. ? Using resistance bands.  Doing a given intensity of exercise for a given amount of time. Choose from these options: ? 150 minutes of moderate-intensity exercise every week. ? 75 minutes of vigorous-intensity exercise every week. ? A mix of moderate-intensity and vigorous-intensity exercise every week.  Children, pregnant women, people who are out of shape, people who are overweight, and older adults may need to consult a health care provider for individual recommendations. If you have any sort of medical condition, be sure to consult your health care provider before starting a new exercise program. What are some activities that can help me to lose weight?  Walking at a rate of at least 4.5 miles an hour.  Jogging or running at a rate of 5 miles per hour.  Biking at a rate of at least 10 miles per hour.  Lap swimming.  Roller-skating or in-line skating.  Cross-country skiing.  Vigorous competitive sports, such as football, basketball, and soccer.  Jumping rope.  Aerobic dancing. How can I be more active in my day-to-day activities?  Use the stairs instead of the elevator.  Take a walk during your lunch break.  If you drive, park your car farther away from work or school.  If you take public transportation, get off one stop early and walk the rest of the way.  Make all of your phone calls while standing up and walking around.  Get up, stretch, and walk around every 30 minutes throughout the day. What guidelines should I follow while exercising?  Do not exercise so much that you hurt yourself, feel dizzy, or get very short of breath.  Consult your health care provider prior to starting a new exercise program.  Wear comfortable  clothes and shoes with good support.  Drink plenty of water while you exercise to prevent dehydration or heat stroke. Body water is lost during exercise and must be replaced.  Work out until you breathe faster and your heart beats faster. This information is not intended to replace advice given to you by your health care provider. Make sure you discuss any questions you have with your health care provider. Document Released: 09/10/2010 Document Revised: 01/14/2016 Document Reviewed: 01/09/2014 Elsevier Interactive Patient Education  [employer].

## 2017-05-10 NOTE — Progress Notes (Signed)
Gynecologic Oncology Visit   Referring Provider: Dr. 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  Subjective:  Madison [name] is a 36 y.o. [phone] female who is seen in consultation from Dr. Prentice Docker for evaluation of invasive adenocarcinoma of uterine cervix.   She presents for surveillance visit. She recently saw Dr. Glennon Mac on 04/21/2017 for her well woman visit.   She is doing well and has no complaints; specifically no vaginal discharge/bleeding, no bladder issues, normal intercourse.   She has regained the weight.   Gynecologic Oncology Madison [name] is a 36 y.o. [phone] female who is seen in consultation from Dr. Prentice Docker for evaluation of invasive adenocarcinoma of uterine cervix.   04/28/2016 CKC on revealed stage IB1 adenocarcinoma of the cervix.  DIAGNOSIS:  A. CERVIX; COLD KNIFE CONIZATION:  - INVASIVE ENDOCERVICAL ADENOCARCINOMA, USUAL TYPE.  - Moderately differentiated - LYMPH VASCULAR INVASION IS PRESENT.  - THE DEEP AND ECTOCERVICAL MARGINS ARE POSITIVE.   05/16/2016 PET/CT negative for metastatic disease.   She underwent robotic-assisted radical hysterectomy, bilateral salpingectomy, ureterolysis, bilateral sentinel lymph node mapping and biopsy, repair of serotomy on 06/09/2016.   Pathology: Invasive endocervical adenocarcinoma, involving 3:00 to 6:00 and 6:00 to 9:00 quadrants. Tumor invades 5/13 mm.  Mucinous Adenocarcinoma Subtype:Endocervical  Histologic Grade:G2: Moderately differentiated  Tumor Size:Greatest dimension (cm): 1.2 cm  Stromal Invasion:  Depth (mm):8 mm  Horizontal Extent (mm):9 mm  Lymph-Vascular Invasion:Not identified  MARGINS  Margins:  Margins:Uninvolved by invasive carcinoma  Distance of Invasive Carcinoma from Closest Margin:Specify (mm): 7 mm  Bilateral SLNs negative (0/3)    Paracervical endometriosis.  Bilateral fallopian tubes: Negative for malignancy. Six lymph parametrial nodes negative for metastatic carcinoma. (0/6)  No further therapy recommended.   She has been NED since.  Problem List: Patient Active Problem List   Diagnosis Date Noted  . Vaginal discharge 07/20/2016  . Cervical cancer, FIGO stage IB1 ([place]) 07/20/2016  . Anxiety disorder 07/11/2016  . Malignant neoplasm of cervix ([place]) 05/11/2016  . Status post cesarean section 12/08/2015    Past Medical History: Past Medical History:  Diagnosis Date  . Anxiety   . Cancer [facility]) 2017   Cervical  . Depression     Past Surgical History: Past Surgical History:  Procedure Laterality Date  . BREAST ENHANCEMENT SURGERY Bilateral   . BREAST SURGERY    . CERVICAL CONIZATION W/BX N/A 04/28/2016   Procedure: CONIZATION CERVIX WITH BIOPSY;  Surgeon: Will Bonnet, MD;  Location: [facility] ORS;  Service: Gynecology;  Laterality: N/A;  . CESAREAN SECTION    . CESAREAN SECTION WITH BILATERAL TUBAL LIGATION Bilateral 12/08/2015   Procedure: CESAREAN SECTION WITH BILATERAL TUBAL LIGATION;  Surgeon: Will Bonnet, MD;  Location: [facility] ORS;  Service: Obstetrics;  Laterality: Bilateral;  . DILATION AND CURETTAGE OF UTERUS    . ROBOTIC ASSISTED LAPAROSCOPIC HYSTERECTOMY AND SALPINGECTOMY  2017    Past Gynecologic History:  Menarche: 12 Menstrual details: n/a Menses regular: n/a Last Menstrual Period: n/a s/p hysterectomy History of Abnormal pap: See HPI Contraception: bilateral tubal ligation Sexually active: yes  OB History:  OB History  Gravida Para Term Preterm AB Living  7 4 4   3 4   SAB TAB Ectopic Multiple Live Births  3     0 4    # Outcome Date GA Lbr Len/2nd Weight Sex Delivery Anes PTL Lv  7 Term 12/08/15 [redacted]w[redacted]d  9 lb 3.8 oz (4.19 kg) M CS-Vac  Spinal  LIV  6 SAB 09/2014          5 Term 01/28/13     CS-LTranv   LIV  4 SAB 07/2011          3 Term 12/15/10     CS-LTranv   LIV  2  SAB 04/2008          1 Term 07/13/98     Vag-Spont   LIV      Family History: Family History  Problem Relation Age of Onset  . Diabetes Mellitus II Father   . Lung cancer Maternal Grandmother   . Ovarian cancer Paternal Grandmother     Social History: Social History   Social History  . Marital status: Married    Spouse name: N/A  . Number of children: N/A  . Years of education: N/A   Occupational History  . Not on file.   Social History Main Topics  . Smoking status: Never Smoker  . Smokeless tobacco: Never Used  . Alcohol use No  . Drug use: No  . Sexual activity: Yes   Other Topics Concern  . Not on file   Social History Narrative  . No narrative on file    Allergies: Allergies  Allergen Reactions  . Tape Other (See Comments)    Burns skin    Current Medications: Current Outpatient Prescriptions  Medication Sig Dispense Refill  . cyclobenzaprine (FLEXERIL) 10 MG tablet Take 1 tablet (10 mg total) by mouth 3 (three) times daily as needed for muscle spasms. (Patient not taking: Reported on 04/21/2017) 45 tablet 0   No current facility-administered medications for this visit.     Review of Systems General: no complaints  HEENT: no complaints  Lungs: no complaints  Cardiac: no complaints  GI: no complaints  GU: no complaints  Musculoskeletal: no complaints  Extremities: no complaints  Skin: no complaints  Neuro: no complaints  Endocrine: no complaints  Psych: no complaints       Objective:  Physical Examination:  BP 117/79   Pulse 81   Temp (!) 97.5 F (36.4 C) (Tympanic)   Resp 18   Ht 5\' 2"  (4.481 m)   Wt 193 lb 3.2 oz (87.6 kg)   LMP 05/07/2016 (Exact Date)   BMI 35.34 kg/m     ECOG Performance Status: 0 - Asymptomatic  General appearance: alert, cooperative and appears stated age HEENT:PERRLA, extra ocular movement intact and sclera clear, anicteric Abdomen: soft, non-tender, without masses or organomegaly, nondistended, no  hernias and well healed incisions CV: RRR Lungs: BCTA Extremities: extremities normal, atraumatic, no cyanosis or edema Neurological exam reveals alert, oriented, normal speech, no focal findings or movement disorder noted.  Pelvic: exam chaperoned by nurse;  Vulva: normal appearing vulva with no masses, tenderness or lesions; Vagina: normal vagina, vaginal cuff healed, no discharge, on BME she has a 8-9 mm nodule to the right of the midline that may represent vaginal scarring. Adnexa: nontender and no enlarged masses; Uterus/Cervix: surgically absent. BME no masses or nodularity. RV confirmatory.   Lab Review n/a  Radiologic Imaging: n/a    Assessment:  Madison [name] is a 36 y.o. female diagnosed with probably stage IB1cervical cancer, adenocarcinoma, grade 2, s/p radical hysterectomy, bilateral SLN negative margins, negative parameteria and negative LVSI.   Vaginal nodule of uncertain significance, possibly due to vaginal scarring or early recurrence.   Medical co-morbidities complicating care: obesity and prior abdominal surgery. Body mass index is 35.34 kg/m.  Plan:  Problem List Items Addressed This Visit    None    Visit Diagnoses    History of cervical cancer    -  Primary      No obvious evidence of disease. Vaginal nodule of uncertain significance, possibly due to vaginal scarring or early recurrence. Close follow up is warranted. Follow up in 3 months with Pap smear.   Continue alternating exams with Dr. Glennon Mac on a every 3 month schedule.   We reviewed her weight and advised nutrition and exercise.   Survivorship plan previously discussed at her postoperative visit as noted below. If NED I have recommended continued close follow up with exams, including pelvic exams every 3-6 months for 2 years, then every 6-12 months for 3-5 years and then annually thereafter. Cervical/vaginal cytology may be considered annually, but is thought to have limited value for detecting  recurrent cervical cancer. Imaging and laboratory assessment is based on clinical indication. Patient education for obesity, lifestyle, exercise, nutrition,sexual health, vaginal dilator use, vaginal lubricants/moisturizers.    The patient's diagnosis, an outline of the further diagnostic and laboratory studies which will be required, the recommendation, and alternatives were discussed.  All questions were answered to the patient's satisfaction.    Gillis Ends, MD    CC:  Dr. Prentice Docker

## 2017-05-10 NOTE — Progress Notes (Signed)
Pt has no gyn concerns, allergies bothering her today with drainage and cough.

## 2017-08-09 ENCOUNTER — Ambulatory Visit: Payer: BLUE CROSS/BLUE SHIELD

## 2017-09-13 ENCOUNTER — Other Ambulatory Visit: Payer: Self-pay | Admitting: Obstetrics and Gynecology

## 2017-09-13 ENCOUNTER — Inpatient Hospital Stay: Payer: BLUE CROSS/BLUE SHIELD | Attending: Obstetrics and Gynecology | Admitting: Obstetrics and Gynecology

## 2017-09-13 VITALS — BP 121/83 | HR 73 | Temp 97.6°F | Resp 18 | Ht 62.0 in | Wt 197.3 lb

## 2017-09-13 DIAGNOSIS — Z6836 Body mass index (BMI) 36.0-36.9, adult: Secondary | ICD-10-CM | POA: Insufficient documentation

## 2017-09-13 DIAGNOSIS — E669 Obesity, unspecified: Secondary | ICD-10-CM | POA: Insufficient documentation

## 2017-09-13 DIAGNOSIS — C539 Malignant neoplasm of cervix uteri, unspecified: Secondary | ICD-10-CM | POA: Insufficient documentation

## 2017-09-13 DIAGNOSIS — Z90722 Acquired absence of ovaries, bilateral: Secondary | ICD-10-CM | POA: Insufficient documentation

## 2017-09-13 DIAGNOSIS — Z90710 Acquired absence of both cervix and uterus: Secondary | ICD-10-CM | POA: Diagnosis not present

## 2017-09-13 NOTE — Progress Notes (Signed)
Gynecologic Oncology Visit   Referring Provider: Dr. Prentice Docker  Chief Concern: stage IB1 cervical adenocarcinoma, surveillance visit.  Subjective:  Madison [name] is a 37 y.o. [phone] female who is seen in consultation from Dr. Prentice Docker for evaluation of invasive adenocarcinoma of uterine cervix, stage IB1 cervical adenocarcinoma. She underwent robotic-assisted radical hysterectomy, bilateral salpingectomy, ureterolysis, bilateral sentinel lymph node mapping and biopsy, repair of serotomy on 06/09/2016.   She presents for surveillance visit. At her last visit with [place] 05/10/2017 she was noted to have 8-9 mm nodule to the right of the midline. She presents for repeat exam. She saw Dr. Glennon Mac on 04/21/2017 for her well woman visit.   She is doing well and has no complaints; specifically no vaginal discharge/bleeding, no bladder issues, normal intercourse.     Gynecologic Oncology Madison [name] is a pleasant [phone] female who is seen in consultation from Dr. Prentice Docker for evaluation of invasive adenocarcinoma of uterine cervix.   04/28/2016 CKC on revealed stage IB1 adenocarcinoma of the cervix.  DIAGNOSIS:  A. CERVIX; COLD KNIFE CONIZATION:  - INVASIVE ENDOCERVICAL ADENOCARCINOMA, USUAL TYPE.  - Moderately differentiated - LYMPH VASCULAR INVASION IS PRESENT.  - THE DEEP AND ECTOCERVICAL MARGINS ARE POSITIVE.   05/16/2016 PET/CT negative for metastatic disease.   She underwent robotic-assisted radical hysterectomy, bilateral salpingectomy, ureterolysis, bilateral sentinel lymph node mapping and biopsy, repair of serotomy on 06/09/2016.   Pathology: Invasive endocervical adenocarcinoma, involving 3:00 to 6:00 and 6:00 to 9:00 quadrants. Tumor invades 5/13 mm.  Mucinous Adenocarcinoma Subtype:Endocervical  Histologic Grade:G2: Moderately differentiated  Tumor Size:Greatest dimension (cm): 1.2 cm  Stromal Invasion:  Depth (mm):8  mm  Horizontal Extent (mm):9 mm  Lymph-Vascular Invasion:Not identified  MARGINS  Margins:  Margins:Uninvolved by invasive carcinoma  Distance of Invasive Carcinoma from Closest Margin:Specify (mm): 7 mm  Bilateral SLNs negative (0/3)   Paracervical endometriosis.  Bilateral fallopian tubes: Negative for malignancy. Six lymph parametrial nodes negative for metastatic carcinoma. (0/6)  No further therapy recommended.   She has been NED since.  Problem List: Patient Active Problem List   Diagnosis Date Noted  . Vaginal discharge 07/20/2016  . Cervical cancer, FIGO stage IB1 ([place]) 07/20/2016  . Anxiety disorder 07/11/2016  . Malignant neoplasm of cervix ([place]) 05/11/2016  . Status post cesarean section 12/08/2015    Past Medical History: Past Medical History:  Diagnosis Date  . Anxiety   . Cancer [facility]) 2017   Cervical  . Depression     Past Surgical History: Past Surgical History:  Procedure Laterality Date  . BREAST ENHANCEMENT SURGERY Bilateral   . BREAST SURGERY    . CERVICAL CONIZATION W/BX N/A 04/28/2016   Procedure: CONIZATION CERVIX WITH BIOPSY;  Surgeon: Will Bonnet, MD;  Location: [facility] ORS;  Service: Gynecology;  Laterality: N/A;  . CESAREAN SECTION    . CESAREAN SECTION WITH BILATERAL TUBAL LIGATION Bilateral 12/08/2015   Procedure: CESAREAN SECTION WITH BILATERAL TUBAL LIGATION;  Surgeon: Will Bonnet, MD;  Location: [facility] ORS;  Service: Obstetrics;  Laterality: Bilateral;  . DILATION AND CURETTAGE OF UTERUS    . ROBOTIC ASSISTED LAPAROSCOPIC HYSTERECTOMY AND SALPINGECTOMY  2017    Past Gynecologic History:  Menarche: 12 Menstrual details: n/a Menses regular: n/a Last Menstrual Period: n/a s/p hysterectomy History of Abnormal pap: See HPI Contraception: bilateral tubal ligation Sexually active: yes  OB History:  OB History  Gravida Para Term Preterm AB Living  7 4 4   3 4   SAB TAB Ectopic  Multiple  Live Births  3     0 4    # Outcome Date GA Lbr Len/2nd Weight Sex Delivery Anes PTL Lv  7 Term 12/08/15 [redacted]w[redacted]d  9 lb 3.8 oz (4.19 kg) M CS-Vac Spinal  LIV  6 SAB 09/2014          5 Term 01/28/13     CS-LTranv   LIV  4 SAB 07/2011          3 Term 12/15/10     CS-LTranv   LIV  2 SAB 04/2008          1 Term 07/13/98     Vag-Spont   LIV      Family History: Family History  Problem Relation Age of Onset  . Diabetes Mellitus II Father   . Lung cancer Maternal Grandmother   . Ovarian cancer Paternal Grandmother     Social History: Social History   Socioeconomic History  . Marital status: Married    Spouse name: Not on file  . Number of children: Not on file  . Years of education: Not on file  . Highest education level: Not on file  Social Needs  . Financial resource strain: Not on file  . Food insecurity - worry: Not on file  . Food insecurity - inability: Not on file  . Transportation needs - medical: Not on file  . Transportation needs - non-medical: Not on file  Occupational History  . Not on file  Tobacco Use  . Smoking status: Never Smoker  . Smokeless tobacco: Never Used  Substance and Sexual Activity  . Alcohol use: No  . Drug use: No  . Sexual activity: Yes  Other Topics Concern  . Not on file  Social History Narrative  . Not on file    Allergies: Allergies  Allergen Reactions  . Tape Other (See Comments)    Burns skin    Current Medications: Current Outpatient Medications  Medication Sig Dispense Refill  . cyclobenzaprine (FLEXERIL) 10 MG tablet Take 1 tablet (10 mg total) by mouth 3 (three) times daily as needed for muscle spasms. 45 tablet 0   No current facility-administered medications for this visit.     Review of Systems General: no complaints  HEENT: no complaints  Lungs: no complaints  Cardiac: no complaints  GI: no complaints  GU: no complaints  Musculoskeletal: no complaints  Extremities: no complaints  Skin: no complaints   Neuro: no complaints  Endocrine: no complaints  Psych: no complaints       Objective:  Physical Examination:  BP 121/83   Pulse 73   Temp 97.6 F (36.4 C) (Tympanic)   Resp 18   Ht 5\' 2"  (1.575 m)   Wt 197 lb 4.8 oz (89.5 kg)   LMP 05/07/2016 (Exact Date)   BMI 36.09 kg/m   ECOG Performance Status: 0 - Asymptomatic  General appearance: alert, cooperative and appears stated age HEENT:PERRLA, extra ocular movement intact and sclera clear, anicteric Abdomen: soft, non-tender, without masses or organomegaly, nondistended, no hernias and well healed incisions CV: RRR Lungs: BCTA Extremities: extremities normal, atraumatic, no cyanosis or edema Neurological exam reveals alert, oriented, normal speech, no focal findings or movement disorder noted. Lymphatic survey: negative supraclavicular, axillary, and inguinal nodes.  Pelvic: exam chaperoned by NP;  Vulva: normal appearing vulva with no masses, tenderness or lesions; Vagina: normal vagina, vaginal cuff healed, no discharge, Pap obtained, on BME she has a 9-10 mm nodule to the right of  the midline that may represent vaginal scarring. Adnexa: nontender and no enlarged masses; Uterus/Cervix: surgically absent. BME no masses or nodularity. RV confirmatory.   Lab Review n/a  Radiologic Imaging: n/a    Assessment:  Madison [name] is a 37 y.o. female diagnosed with probably stage IB1cervical cancer, adenocarcinoma, grade 2, s/p radical hysterectomy, bilateral SLN negative margins, negative parameteria and negative LVSI.   Vaginal nodule of uncertain significance, possibly due to vaginal scarring or early recurrence, size overall stable which favors scarring.   Medical co-morbidities complicating care: obesity and prior abdominal surgery. Body mass index is 36.09 kg/m.  Plan:   Problem List Items Addressed This Visit      Genitourinary   Malignant neoplasm of cervix ([place]) - Primary   Relevant Orders   Pap liquid-based and  HPV (high risk)      No obvious evidence of disease. Vaginal nodule of uncertain significance, but overall stable size favoring vaginal scarring rather than early recurrence. Close follow up is warranted.   Pap smear today.  Reviewed findings from [facility] trial demonstrating worse survival outcomes with MIS radical hysterectomy compared to open procedures. The outcomes for tumors <2 cm, which is her situation, was reassuring. I recommended continued close surveillance. At this time no further imaging procedures are indicated. Continue alternating exams with Dr. Glennon Mac on a every 3 month schedule. She will see him in April and RTC with [place] in 6 months.   We reviewed her weight and advised nutrition and exercise.   Survivorship plan previously discussed at her postoperative visit as noted below. If NED I have recommended continued close follow up with exams, including pelvic exams every 3-6 months for 2 years, then every 6-12 months for 3-5 years and then annually thereafter. Cervical/vaginal cytology may be considered annually, but is thought to have limited value for detecting recurrent cervical cancer. Imaging and laboratory assessment is based on clinical indication. Patient education for obesity, lifestyle, exercise, nutrition,sexual health, vaginal dilator use, vaginal lubricants/moisturizers.    The patient's diagnosis, an outline of the further diagnostic and laboratory studies which will be required, the recommendation, and alternatives were discussed.  All questions were answered to the patient's satisfaction.    Gillis Ends, MD    CC:  Dr. Prentice Docker

## 2017-09-13 NOTE — Progress Notes (Signed)
No gyn concerns 

## 2017-09-15 LAB — PAP LB AND HPV HIGH-RISK
HPV, HIGH-RISK: NEGATIVE
PAP Smear Comment: 0

## 2017-12-19 ENCOUNTER — Encounter: Payer: Self-pay | Admitting: Obstetrics and Gynecology

## 2017-12-19 ENCOUNTER — Ambulatory Visit (INDEPENDENT_AMBULATORY_CARE_PROVIDER_SITE_OTHER): Payer: BLUE CROSS/BLUE SHIELD | Admitting: Obstetrics and Gynecology

## 2017-12-19 VITALS — BP 128/74 | Ht 62.0 in | Wt 196.0 lb

## 2017-12-19 DIAGNOSIS — C538 Malignant neoplasm of overlapping sites of cervix uteri: Secondary | ICD-10-CM | POA: Diagnosis not present

## 2017-12-19 DIAGNOSIS — C539 Malignant neoplasm of cervix uteri, unspecified: Secondary | ICD-10-CM | POA: Diagnosis not present

## 2017-12-19 NOTE — Progress Notes (Signed)
Obstetrics & Gynecology Office Visit    Chief Complaint  Patient presents with  . Surveillance    Cervical cancer    History of Present Illness: 37 y.o. Madison [name] here for surveillance for Stage 1B1 cervical adenocarcinoma.  She is status post robotic-assisted radical hysterectomy, bilateral salpingectomy, ureterolysis, bilateral sentinel lymph node mapping and biopsy, repair of serotomy on 06/09/2016.    She presents for a surveillance visit. Hat her last visit on 09/13/2017 she was noted to have a stable 8-9 mm nodule to the right of midline.   Negative pap smear with negative HPV at visit on 09/13/17 with Dr. Theora Gianotti.   She is doing well with no complaints. She specifically denies vaginal discharge, bleeding, no bladder issues, normal intercourse, no hematochezia, melena, new cough, headache, weight loss.   GYN ONC history (coped from Dr. Gershon Crane note)  04/28/2016 CKC on revealed stage IB1 adenocarcinoma of the cervix.  DIAGNOSIS:  A. CERVIX; COLD KNIFE CONIZATION:  - INVASIVE ENDOCERVICAL ADENOCARCINOMA, USUAL TYPE.  - Moderately differentiated - LYMPH VASCULAR INVASION IS PRESENT.  - THE DEEP AND ECTOCERVICAL MARGINS ARE POSITIVE.   05/16/2016 PET/CT negative for metastatic disease.   She underwent robotic-assisted radical hysterectomy, bilateral salpingectomy, ureterolysis, bilateral sentinel lymph node mapping and biopsy, repair of serotomy on 06/09/2016.   Pathology: Invasive endocervical adenocarcinoma, involving 3:00 to 6:00 and 6:00 to 9:00 quadrants. Tumor invades 5/13 mm.  Mucinous Adenocarcinoma Subtype:Endocervical  Histologic Grade:G2: Moderately differentiated  Tumor Size:Greatest dimension (cm): 1.2 cm  Stromal Invasion:  Depth (mm):8 mm  Horizontal Extent (mm):9 mm  Lymph-Vascular Invasion:Not identified  MARGINS  Margins:  Margins:Uninvolved by invasive carcinoma  Distance of Invasive  Carcinoma from Closest Margin:Specify (mm): 7 mm  Bilateral SLNs negative (0/3)   Paracervical endometriosis.  Bilateral fallopian tubes: Negative for malignancy. Six lymph parametrial nodes negative for metastatic carcinoma. (0/6)  No further therapy recommended.   Past Medical History:  Diagnosis Date  . Anxiety   . Cancer [facility]) 2017   Cervical  . Depression     Past Surgical History:  Procedure Laterality Date  . BREAST ENHANCEMENT SURGERY Bilateral   . BREAST SURGERY    . CERVICAL CONIZATION W/BX N/A 04/28/2016   Procedure: CONIZATION CERVIX WITH BIOPSY;  Surgeon: Will Bonnet, MD;  Location: [facility] ORS;  Service: Gynecology;  Laterality: N/A;  . CESAREAN SECTION    . CESAREAN SECTION WITH BILATERAL TUBAL LIGATION Bilateral 12/08/2015   Procedure: CESAREAN SECTION WITH BILATERAL TUBAL LIGATION;  Surgeon: Will Bonnet, MD;  Location: [facility] ORS;  Service: Obstetrics;  Laterality: Bilateral;  . DILATION AND CURETTAGE OF UTERUS    . ROBOTIC ASSISTED LAPAROSCOPIC HYSTERECTOMY AND SALPINGECTOMY  2017    Gynecologic History: Patient's last menstrual period was 05/07/2016 (exact date).  Obstetric History: [id number]  Family History  Problem Relation Age of Onset  . Diabetes Mellitus II Father   . Lung cancer Maternal Grandmother   . Ovarian cancer Paternal Grandmother     Social History   Socioeconomic History  . Marital status: Married    Spouse name: Not on file  . Number of children: Not on file  . Years of education: Not on file  . Highest education level: Not on file  Occupational History  . Not on file  Social Needs  . Financial resource strain: Not on file  . Food insecurity:    Worry: Not on file    Inability: Not on file  . Transportation needs:    Medical: Not on  file    Non-medical: Not on file  Tobacco Use  . Smoking status: Never Smoker  . Smokeless tobacco: Never Used  Substance and Sexual Activity  . Alcohol use: No  . Drug use: No    . Sexual activity: Yes    Birth control/protection: Surgical  Lifestyle  . Physical activity:    Days per week: Not on file    Minutes per session: Not on file  . Stress: Not on file  Relationships  . Social connections:    Talks on phone: Not on file    Gets together: Not on file    Attends religious service: Not on file    Active member of club or organization: Not on file    Attends meetings of clubs or organizations: Not on file    Relationship status: Not on file  . Intimate partner violence:    Fear of current or ex partner: Not on file    Emotionally abused: Not on file    Physically abused: Not on file    Forced sexual activity: Not on file  Other Topics Concern  . Not on file  Social History Narrative  . Not on file    Allergies  Allergen Reactions  . Tape Other (See Comments)    Burns skin    Prior to Admission medications   Medication Sig Start Date End Date Taking? Authorizing Provider  cyclobenzaprine (FLEXERIL) 10 MG tablet Take 1 tablet (10 mg total) by mouth 3 (three) times daily as needed for muscle spasms. 08/25/16  Yes Volney American, PA-C    Review of Systems  Constitutional: Negative.   HENT: Negative.   Eyes: Negative.   Respiratory: Negative.   Cardiovascular: Negative.   Gastrointestinal: Negative.   Genitourinary: Negative.   Musculoskeletal: Negative.   Skin: Negative.   Neurological: Negative.   Psychiatric/Behavioral: Negative.      Physical Exam BP 128/74   Ht 5\' 2"  (1.575 m)   Wt 196 lb (88.9 kg)   LMP 05/07/2016 (Exact Date)   BMI 35.85 kg/m  Patient's last menstrual period was 05/07/2016 (exact date). Physical Exam  Constitutional: She is oriented to person, place, and time. She appears well-developed and well-nourished. No distress.  Genitourinary: Vagina normal. Pelvic exam was performed with patient supine. There is no rash, tenderness or lesion on the right labia. There is no rash, tenderness or lesion on the left  labia. No bleeding in the vagina. No foreign body in the vagina. No signs of injury around the vagina. Right adnexum does not display mass, does not display tenderness and does not display fullness. Left adnexum does not display mass, does not display tenderness and does not display fullness.  Genitourinary Comments: Noted 8-9 mm nodule just ride of midline on the vaginal cuff.  No bleeding, nor surrounding erythema.  Non-tender.   HENT:  Head: Normocephalic and atraumatic.  Eyes: Conjunctivae are normal. No scleral icterus.  Neck: Normal range of motion. Neck supple. No thyromegaly present.  Cardiovascular: Normal rate and regular rhythm. Exam reveals no gallop and no friction rub.  No murmur heard. Pulmonary/Chest: Effort normal and breath sounds normal. No stridor. No respiratory distress. She has no wheezes. She has no rales.  Abdominal: Soft. Bowel sounds are normal. She exhibits no distension and no mass. There is no tenderness. There is no rebound and no guarding.  Musculoskeletal: Normal range of motion. She exhibits no edema.  Lymphadenopathy:    She has no cervical adenopathy.  Neurological:  She is alert and oriented to person, place, and time. No cranial nerve deficit.  Skin: Skin is warm. No erythema.  Psychiatric: She has a normal mood and affect. Her behavior is normal. Judgment normal.   Female chaperone present for pelvic and breast  portions of the physical exam  Assessment: 37 y.o. [id number] female here for surveillance of stage 1B1 cervical cancer, adenocarcinoma, grade 2, s/p radical hysterectomy, bilateral SLN negative margins, negative parametria and negative LVSI.    Plan: Problem List Items Addressed This Visit      Genitourinary   Malignant neoplasm of cervix ([place]) - Primary     Other   Cervical cancer, FIGO stage IB1 ([place])     Patient to see Dr. Theora Gianotti for ongoing surveillance in 3 months.  May see me 3 months after that for ongoing surveillance.  However, she  states that she is moving to [place], [place], sometime soon. Dr. Theora Gianotti is aware of this, per the patient, and will be making a recommendation regarding providers to assume her ongoing surveillance.   No evidence of recurrence today. Likely stable vaginal nodule. Favor scar tissue given the overall stability of the nodule.  Pap smear 3 months ago was negative.  Return in about 6 months (around 06/20/2018) for Annual Gynecologic Examination.   Prentice Docker, MD 12/19/2017 1:11 PM

## 2018-03-07 ENCOUNTER — Ambulatory Visit: Payer: BLUE CROSS/BLUE SHIELD

## 2018-04-11 ENCOUNTER — Inpatient Hospital Stay: Payer: BLUE CROSS/BLUE SHIELD | Attending: Obstetrics and Gynecology | Admitting: Obstetrics and Gynecology

## 2018-04-11 ENCOUNTER — Inpatient Hospital Stay: Payer: BLUE CROSS/BLUE SHIELD

## 2018-04-11 VITALS — BP 117/81 | HR 76 | Temp 97.1°F | Resp 18 | Ht 62.0 in | Wt 185.1 lb

## 2018-04-11 DIAGNOSIS — Z90710 Acquired absence of both cervix and uterus: Secondary | ICD-10-CM

## 2018-04-11 DIAGNOSIS — Z90722 Acquired absence of ovaries, bilateral: Secondary | ICD-10-CM

## 2018-04-11 DIAGNOSIS — C530 Malignant neoplasm of endocervix: Secondary | ICD-10-CM

## 2018-04-11 DIAGNOSIS — C539 Malignant neoplasm of cervix uteri, unspecified: Secondary | ICD-10-CM

## 2018-04-11 NOTE — Progress Notes (Signed)
No gyn concerns today, she recently moved to [place] but would like to keep her care here. Her mother just recently got dx with cervical cancer

## 2018-04-11 NOTE — Progress Notes (Signed)
Gynecologic Oncology Visit   Referring Provider: Dr. Prentice Docker  Chief Concern: stage IB1 cervical adenocarcinoma, surveillance visit.  Subjective:  Madison [name] is a 37 y.o. [phone] female who is seen in consultation from Dr. Prentice Docker for evaluation of invasive adenocarcinoma of uterine cervix, stage IB1 cervical adenocarcinoma. She underwent robotic-assisted radical hysterectomy, bilateral salpingectomy, ureterolysis, bilateral sentinel lymph node mapping and biopsy, repair of serotomy on 06/09/2016.   She presents for surveillance visit. She was last seen in clinic on 09/13/17. Pap was obtained at that time. On BME, she was noted to have 9-10 mm nodule right of midline thought to be vaginal scarring. In interim, she was seen by Dr. Glennon Mac on 12/19/17 who also noted nodularity which was reported as stable.   09/13/17- Pap - NILM, HPV negative.   Today, she reports overall feeling well and denies specific complaints. She denies vaginal discharge/bleeding. She denies bladder issues. Reports normal intercourse. She has moved to [place] 3 weeks ago but wishes to continue to keep her care here in [place].     Gynecologic Oncology Madison [name] is a pleasant [id number] female who is seen in consultation from Dr. Prentice Docker for evaluation of invasive adenocarcinoma of uterine cervix.   04/28/2016 CKC on revealed stage IB1 adenocarcinoma of the cervix.  DIAGNOSIS:  A. CERVIX; COLD KNIFE CONIZATION:  - INVASIVE ENDOCERVICAL ADENOCARCINOMA, USUAL TYPE.  - Moderately differentiated - LYMPH VASCULAR INVASION IS PRESENT.  - THE DEEP AND ECTOCERVICAL MARGINS ARE POSITIVE.   05/16/2016 PET/CT negative for metastatic disease.   She underwent robotic-assisted radical hysterectomy, bilateral salpingectomy, ureterolysis, bilateral sentinel lymph node mapping and biopsy, repair of serotomy on 06/09/2016.   Pathology: Invasive endocervical adenocarcinoma, involving 3:00 to 6:00 and 6:00 to 9:00  quadrants. Tumor invades 5/13 mm.  Mucinous Adenocarcinoma Subtype:Endocervical  Histologic Grade:G2: Moderately differentiated  Tumor Size:Greatest dimension (cm): 1.2 cm  Stromal Invasion:  Depth (mm):8 mm  Horizontal Extent (mm):9 mm  Lymph-Vascular Invasion:Not identified  MARGINS  Margins:Uninvolved by invasive carcinoma  Distance of Invasive Carcinoma from Closest Margin:Specify (mm): 7 mm  Bilateral SLNs negative (0/3)   Paracervical endometriosis.  Bilateral fallopian tubes: Negative for malignancy. Six lymph parametrial nodes negative for metastatic carcinoma. (0/6)  No further therapy recommended.   Reviewed findings from [facility] trial demonstrating worse survival outcomes with MIS radical hysterectomy compared to open procedures. The outcomes for tumors <2 cm, which is her situation, was reassuring. I recommended continued close surveillance.  She has been NED.   Problem List: Patient Active Problem List   Diagnosis Date Noted  . Vaginal discharge 07/20/2016  . Cervical cancer, FIGO stage IB1 ([place]) 07/20/2016  . Anxiety disorder 07/11/2016  . Malignant neoplasm of cervix ([place]) 05/11/2016  . Status post cesarean section 12/08/2015    Past Medical History: Past Medical History:  Diagnosis Date  . Anxiety   . Cancer [facility]) 2017   Cervical  . Depression     Past Surgical History: Past Surgical History:  Procedure Laterality Date  . BREAST ENHANCEMENT SURGERY Bilateral   . BREAST SURGERY    . CERVICAL CONIZATION W/BX N/A 04/28/2016   Procedure: CONIZATION CERVIX WITH BIOPSY;  Surgeon: Will Bonnet, MD;  Location: [facility] ORS;  Service: Gynecology;  Laterality: N/A;  . CESAREAN SECTION    . CESAREAN SECTION WITH BILATERAL TUBAL LIGATION Bilateral 12/08/2015   Procedure: CESAREAN SECTION WITH BILATERAL TUBAL LIGATION;  Surgeon: Will Bonnet, MD;  Location: [facility] ORS;  Service: Obstetrics;   Laterality:  Bilateral;  . DILATION AND CURETTAGE OF UTERUS    . ROBOTIC ASSISTED LAPAROSCOPIC HYSTERECTOMY AND SALPINGECTOMY  2017    Past Gynecologic History:  Menarche: 12 Menstrual details: n/a Menses regular: n/a Last Menstrual Period: n/a s/p hysterectomy History of Abnormal pap: See HPI Contraception: bilateral tubal ligation Sexually active: yes  OB History:  OB History  Gravida Para Term Preterm AB Living  7 4 4   3 4   SAB TAB Ectopic Multiple Live Births  3     0 4    # Outcome Date GA Lbr Len/2nd Weight Sex Delivery Anes PTL Lv  7 Term 12/08/15 [redacted]w[redacted]d  9 lb 3.8 oz (4.19 kg) M CS-Vac Spinal  LIV  6 SAB 09/2014          5 Term 01/28/13     CS-LTranv   LIV  4 SAB 07/2011          3 Term 12/15/10     CS-LTranv   LIV  2 SAB 04/2008          1 Term 07/13/98     Vag-Spont   LIV    Family History: Family History  Problem Relation Age of Onset  . Diabetes Mellitus II Father   . Lung cancer Maternal Grandmother   . Ovarian cancer Paternal Grandmother   . Cervical cancer Mother     Social History: Social History   Socioeconomic History  . Marital status: Married    Spouse name: Not on file  . Number of children: Not on file  . Years of education: Not on file  . Highest education level: Not on file  Occupational History  . Not on file  Social Needs  . Financial resource strain: Not on file  . Food insecurity:    Worry: Not on file    Inability: Not on file  . Transportation needs:    Medical: Not on file    Non-medical: Not on file  Tobacco Use  . Smoking status: Never Smoker  . Smokeless tobacco: Never Used  Substance and Sexual Activity  . Alcohol use: No  . Drug use: No  . Sexual activity: Yes    Birth control/protection: Surgical  Lifestyle  . Physical activity:    Days per week: Not on file    Minutes per session: Not on file  . Stress: Not on file  Relationships  . Social connections:    Talks on phone: Not on file    Gets together: Not on  file    Attends religious service: Not on file    Active member of club or organization: Not on file    Attends meetings of clubs or organizations: Not on file    Relationship status: Not on file  . Intimate partner violence:    Fear of current or ex partner: Not on file    Emotionally abused: Not on file    Physically abused: Not on file    Forced sexual activity: Not on file  Other Topics Concern  . Not on file  Social History Narrative  . Not on file    Allergies: Allergies  Allergen Reactions  . Tape Other (See Comments)    Burns skin    Current Medications: Current Outpatient Medications  Medication Sig Dispense Refill  . cyclobenzaprine (FLEXERIL) 10 MG tablet Take 1 tablet (10 mg total) by mouth 3 (three) times daily as needed for muscle spasms. 45 tablet 0   No current facility-administered medications for this  visit.     Review of Systems General:  no complaints Skin: no complaints Eyes: no complaints HEENT: no complaints Breasts: no complaints Pulmonary: no complaints Cardiac: no complaints Gastrointestinal: no complaints Genitourinary/Sexual: no complaints Ob/Gyn: no complaints Musculoskeletal: no complaints Hematology: no complaints Neurologic/Psych: no complaints   Objective:  Physical Examination:  BP 117/81   Pulse 76   Temp (!) 97.1 F (36.2 C) (Tympanic)   Resp 18   Ht 5\' 2"  (1.575 m)   Wt 185 lb 1.6 oz (84 kg)   LMP 05/07/2016 (Exact Date)   BMI 33.86 kg/m   Vitals:   04/11/18 1018  BP: 117/81  Pulse: 76  Resp: 18  Temp: (!) 97.1 F (36.2 C)    GENERAL: Patient is a well appearing female in no acute distress HEENT:  Sclerae anicteric.  Oropharynx clear and moist. Neck is supple.  LUNGS:  Clear to auscultation bilaterally.  No wheezes or rhonchi. HEART:  Regular rate and rhythm. No murmur appreciated. ABDOMEN:  Soft, nontender.  Positive, normoactive bowel sounds.  EXTREMITIES:  No peripheral edema.   SKIN:  Clear with no  obvious rashes or skin changes.  NEURO:  Nonfocal. Well oriented.  Appropriate affect.   Pelvic: exam chaperoned by NP;  Vulva: normal appearing vulva with no masses, tenderness or lesions; Vagina: normal vagina; Uterus: surgically absent, vaginal cuff well healed; Cervix: absent; BME Adnexa: not enlarged and vaginal nodularity decreased in size. A prominent nodule was not palpated. There was minimal scattered nodularity < 52mm on palpation, but no dominant nodule;  Rectal: not indicated   Lab Review n/a  Radiologic Imaging: n/a    Assessment:  Madison [name] is a 37 y.o. female diagnosed with probably stage IB1cervical cancer, adenocarcinoma, grade 2, s/p radical hysterectomy, bilateral SLN negative margins, negative parameteria and negative LVSI.    Vaginal nodule of uncertain significance, no now longer prominent and suspect vaginal scarring.  Medical co-morbidities complicating care: obesity and prior abdominal surgery. Body mass index is 33.86 kg/m.  Plan:   Problem List Items Addressed This Visit      Other   Cervical cancer, FIGO stage IB1 ([place]) - Primary      No obvious evidence of disease. Vaginal nodule now less prominent favoring vaginal scarring rather than early recurrence. Close follow up still is warranted.   Continue alternating exams with Dr. Glennon Mac on a every 3 month schedule. She will see him in November and RTC with [place] in 6 months.   We reviewed her weight, congratulated her on 11 pound weight loss and advised continued nutrition and exercise.   Survivorship plan previously discussed at her postoperative visit as noted below. If NED I have recommended continued close follow up with exams, including pelvic exams every 3-6 months for 2 years, then every 6-12 months for 3-5 years and then annually thereafter. Cervical/vaginal cytology may be considered annually, but is thought to have limited value for detecting recurrent cervical cancer. Imaging and laboratory  assessment is based on clinical indication. Patient education for obesity, lifestyle, exercise, nutrition,sexual health, vaginal dilator use, vaginal lubricants/moisturizers was provided.   The patient's diagnosis, an outline of the further diagnostic and laboratory studies which will be required, the recommendation, and alternatives were discussed.  All questions were answered to the patient's satisfaction.    Madison Au, NP  I personally had a face to face interaction and evaluated the patient jointly with the NP, Ms. Beckey Rutter.  I have reviewed her history and  available records and have performed the key portions of the physical exam including abdominal exam, HEENT, and pelvic exam with my findings confirming those documented above by the APP.  I have discussed the case with the APP and the patient.  I agree with the above documentation, assessment and plan which was fully formulated by me.  Counseling was completed by me.   I personally saw the patient and performed a substantive portion of this encounter in conjunction with the listed APP as documented above.  A total of 25 minutes were spent with the patient/family today; >50% was spent in education, counseling and coordination of care for cervical cancer.  Arul Farabee Gaetana Michaelis, MD   CC:  Dr. Prentice Docker

## 2018-07-03 ENCOUNTER — Ambulatory Visit: Payer: BLUE CROSS/BLUE SHIELD | Admitting: Obstetrics and Gynecology

## 2018-07-10 ENCOUNTER — Ambulatory Visit (INDEPENDENT_AMBULATORY_CARE_PROVIDER_SITE_OTHER): Payer: BLUE CROSS/BLUE SHIELD | Admitting: Obstetrics and Gynecology

## 2018-07-10 ENCOUNTER — Other Ambulatory Visit (HOSPITAL_COMMUNITY)
Admission: RE | Admit: 2018-07-10 | Discharge: 2018-07-10 | Disposition: A | Discharge status: Home / Self Care | Payer: BLUE CROSS/BLUE SHIELD | Source: Ambulatory Visit | Attending: Obstetrics and Gynecology | Admitting: Obstetrics and Gynecology

## 2018-07-10 ENCOUNTER — Encounter: Payer: Self-pay | Admitting: Obstetrics and Gynecology

## 2018-07-10 VITALS — BP 122/74 | Ht 62.0 in | Wt 185.0 lb

## 2018-07-10 DIAGNOSIS — Z1331 Encounter for screening for depression: Secondary | ICD-10-CM

## 2018-07-10 DIAGNOSIS — C539 Malignant neoplasm of cervix uteri, unspecified: Secondary | ICD-10-CM

## 2018-07-10 DIAGNOSIS — Z8041 Family history of malignant neoplasm of ovary: Secondary | ICD-10-CM

## 2018-07-10 DIAGNOSIS — Z01419 Encounter for gynecological examination (general) (routine) without abnormal findings: Secondary | ICD-10-CM | POA: Diagnosis not present

## 2018-07-10 DIAGNOSIS — Z1339 Encounter for screening examination for other mental health and behavioral disorders: Secondary | ICD-10-CM

## 2018-07-10 NOTE — Progress Notes (Addendum)
Gynecology Annual Exam  PCP: Valerie Roys, DO   Chief Complaint  Patient presents with  . Annual Exam  Surveillance exam for cervical cancer  History of Present Illness:  [name] is a 37 y.o. [id number] who LMP was Patient's last menstrual period was 05/07/2016 (exact date)., presents today for her annual examination.  Her menses are absent due to hysterectomy.  She has a history of cervical cancer and is status post robot assisted radical hysterectomy. 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  She underwent a radical hysterectomy, bilateral salpingectomy.  Review at [place] tumor board resulted in the recommendation for no further treatment (no radiation or chemotherapy).  She is being monitored by Dr. Theora Gianotti from [place], alternative surveillance visits with me. Today she specifically denies vaginal bleeding, lower abdominal pain, lateral back pain, bloating, early satiety, cough, trouble breathing, headaches, unintended weight loss.   She is single partner, contraception - status post hysterectomy.  Last Pap:  30-Nov-2017, NILM, HPV negative  Hx of STDs: none  Last mammogram: n/a There is no FH of breast cancer. There is a FH of ovarian cancer (PGM diagnosed in early 2022/12/01, she passed away from that). The patient does not do self-breast exams.  Tobacco use: The patient denies current or previous tobacco use. Alcohol use: none Exercise: not active  The patient wears seatbelts: yes.      Past Medical History:  Diagnosis Date  . Anxiety   . Cancer [facility]) 12/01/15   Cervical  . Depression     Past Surgical History:  Procedure Laterality Date  . BREAST ENHANCEMENT SURGERY Bilateral   . BREAST SURGERY    . CERVICAL CONIZATION W/BX N/A 04/28/2016   Procedure: CONIZATION CERVIX WITH BIOPSY;  Surgeon: Will Bonnet, MD;  Location: [facility] ORS;  Service: Gynecology;  Laterality: N/A;  . CESAREAN SECTION    .  CESAREAN SECTION WITH BILATERAL TUBAL LIGATION Bilateral 12/08/2015   Procedure: CESAREAN SECTION WITH BILATERAL TUBAL LIGATION;  Surgeon: Will Bonnet, MD;  Location: [facility] ORS;  Service: Obstetrics;  Laterality: Bilateral;  . DILATION AND CURETTAGE OF UTERUS    . ROBOTIC ASSISTED LAPAROSCOPIC HYSTERECTOMY AND SALPINGECTOMY  December 01, 2015    Prior to Admission medications: denies    Allergies  Allergen Reactions  . Tape Other (See Comments)    Burns skin   Gynecologic History: Patient's last menstrual period was 05/07/2016 (exact date).  Obstetric History: [id number]  Social History   Socioeconomic History  . Marital status: Married    Spouse name: Not on file  . Number of children: Not on file  . Years of education: Not on file  . Highest education level: Not on file  Occupational History  . Not on file  Social Needs  . Financial resource strain: Not on file  . Food insecurity:    Worry: Not on file    Inability: Not on file  . Transportation needs:    Medical: Not on file    Non-medical: Not on file  Tobacco Use  . Smoking status: Never Smoker  . Smokeless tobacco: Never Used  Substance and Sexual Activity  . Alcohol use: No  . Drug use: No  . Sexual activity: Yes    Birth control/protection: Surgical  Lifestyle  . Physical activity:    Days per week: Not on file    Minutes per session: Not on file  . Stress: Not on  file  Relationships  . Social connections:    Talks on phone: Not on file    Gets together: Not on file    Attends religious service: Not on file    Active member of club or organization: Not on file    Attends meetings of clubs or organizations: Not on file    Relationship status: Not on file  . Intimate partner violence:    Fear of current or ex partner: Not on file    Emotionally abused: Not on file    Physically abused: Not on file    Forced sexual activity: Not on file  Other Topics Concern  . Not on file  Social History Narrative  . Not on  file    Family History  Problem Relation Age of Onset  . Diabetes Mellitus II Father   . Lung cancer Maternal Grandmother   . Ovarian cancer Paternal Grandmother   . Cervical cancer Mother     Review of Systems  Constitutional: Negative.   HENT: Negative.   Eyes: Negative.   Respiratory: Negative.   Cardiovascular: Negative.   Gastrointestinal: Negative.   Genitourinary: Negative.   Musculoskeletal: Negative.   Skin: Negative.   Neurological: Negative.   Psychiatric/Behavioral: Negative.      Physical Exam BP 122/74   Ht 5\' 2"  (1.575 m)   Wt 185 lb (83.9 kg)   LMP 05/07/2016 (Exact Date)   BMI 33.84 kg/m    Physical Exam  Constitutional: She is oriented to person, place, and time. She appears well-developed and well-nourished. No distress.  Genitourinary: Pelvic exam was performed with patient supine. There is no rash, tenderness, lesion or injury on the right labia. There is no rash, tenderness, lesion or injury on the left labia. No erythema, tenderness or bleeding in the vagina. No signs of injury around the vagina. No vaginal discharge found. Right adnexum does not display mass, does not display tenderness and does not display fullness. Left adnexum does not display mass, does not display tenderness and does not display fullness.  Genitourinary Comments: Vaginal cuff intact. An approximately 3 mm nodule noted just right of midline.  Cytology taken given the last exam report of decrease in size of nodule.  Rectovaginal is confirmatory with no pelvic fullness, no appreciable nodularity posteriorly.   HENT:  Head: Normocephalic and atraumatic.  Eyes: EOM are normal. No scleral icterus.  Neck: Normal range of motion. Neck supple. No thyromegaly present.  Cardiovascular: Normal rate and regular rhythm. Exam reveals no gallop and no friction rub.  No murmur heard. Pulmonary/Chest: Effort normal and breath sounds normal. No respiratory distress. She has no wheezes. She has no  rales. Right breast exhibits no inverted nipple, no mass, no nipple discharge, no skin change and no tenderness. Left breast exhibits no inverted nipple, no mass, no nipple discharge, no skin change and no tenderness.  Abdominal: Soft. Bowel sounds are normal. She exhibits no distension and no mass. There is no tenderness. There is no rebound and no guarding.  Musculoskeletal: Normal range of motion. She exhibits no edema or tenderness.  Lymphadenopathy:    She has no cervical adenopathy.       Right: No inguinal adenopathy present.       Left: No inguinal adenopathy present.  Neurological: She is alert and oriented to person, place, and time. No cranial nerve deficit.  Skin: Skin is warm and dry. No rash noted. No erythema.  Psychiatric: She has a normal mood and affect. Her behavior  is normal. Judgment normal.    Female chaperone present for pelvic and breast  portions of the physical exam  Results: AUDIT Questionnaire (screen for alcoholism): 0 PHQ-9: 0   Assessment: 37 y.o. [id number] female here for routine annual gynecologic examination.  Plan: Problem List Items Addressed This Visit      Other   Cervical cancer, FIGO stage IB1 ([place])   Relevant Orders   Cytology - PAP    Other Visit Diagnoses    Women's annual routine gynecological examination    -  Primary   Relevant Orders   Cytology - PAP   Screening for depression       Screening for alcoholism          Screening: -- Blood pressure screen normal -- Colonoscopy - not due -- Mammogram - not due -- Weight screening: obese: discussed management options, including lifestyle, dietary, and exercise. -- Depression screening negative (PHQ-9) -- Nutrition: normal -- cholesterol screening: not due for screening -- osteoporosis screening: not due -- tobacco screening: not using -- alcohol screening: AUDIT questionnaire indicates low-risk usage. -- family history of breast cancer screening: done. not at high risk. -- no  evidence of domestic violence or intimate partner violence. -- STD screening: gonorrhea/chlamydia NAAT not collected per patient request. -- pap smear collected given changes in findings on exams.  Last pap smear nearly one year ago and normal with negative HPV.   Family History of ovarian cancer: briefly discussed baseline population risk of ovarian cancer. Her paternal grandmother was diagnosed at a young age (in her 63s).  Discussed consideration for genetic mutation screening. I will defer to Dr. Theora Gianotti when she visits with her in 3 months to have any necessary further discussion of significance and consideration of testing.   Follow up in 3 months with Dr. Theora Gianotti.  Surveillance schedule per Dr. Theora Gianotti.   Prentice Docker, MD 07/10/2018 1:09 PM

## 2018-07-13 LAB — CYTOLOGY - PAP
Diagnosis: NEGATIVE
HPV: NOT DETECTED

## 2018-10-10 ENCOUNTER — Inpatient Hospital Stay: Payer: BLUE CROSS/BLUE SHIELD

## 2018-10-17 ENCOUNTER — Inpatient Hospital Stay: Payer: BLUE CROSS/BLUE SHIELD | Attending: Obstetrics and Gynecology | Admitting: Obstetrics and Gynecology

## 2018-10-17 ENCOUNTER — Other Ambulatory Visit: Payer: Self-pay

## 2018-10-17 VITALS — BP 125/87 | HR 83 | Temp 97.1°F | Resp 18 | Wt 186.2 lb

## 2018-10-17 DIAGNOSIS — C539 Malignant neoplasm of cervix uteri, unspecified: Secondary | ICD-10-CM

## 2018-10-17 DIAGNOSIS — C530 Malignant neoplasm of endocervix: Secondary | ICD-10-CM | POA: Diagnosis present

## 2018-10-17 DIAGNOSIS — Z90710 Acquired absence of both cervix and uterus: Secondary | ICD-10-CM | POA: Diagnosis not present

## 2018-10-17 DIAGNOSIS — Z9079 Acquired absence of other genital organ(s): Secondary | ICD-10-CM | POA: Diagnosis not present

## 2018-10-17 DIAGNOSIS — E669 Obesity, unspecified: Secondary | ICD-10-CM | POA: Insufficient documentation

## 2018-10-17 DIAGNOSIS — N898 Other specified noninflammatory disorders of vagina: Secondary | ICD-10-CM | POA: Insufficient documentation

## 2018-10-17 NOTE — Progress Notes (Signed)
Here for follow up. " everything is going pretty good/ no issues or concerns "

## 2018-10-17 NOTE — Patient Instructions (Signed)
Please schedule appointment with Dr. Glennon Mac in 3 months (12/2018). We will see you back in 6 months or sooner if you develop any concerning symptoms. Thank you for allowing [place] to participate in your care.

## 2018-10-17 NOTE — Progress Notes (Signed)
Gynecologic Oncology Visit   Referring Provider: Dr. Prentice Docker  Chief Concern: stage IB1 cervical adenocarcinoma, surveillance visit.  Subjective:  Madison [name] is a 38 y.o. [phone] female who is seen in consultation from Dr. Prentice Docker for evaluation of invasive adenocarcinoma of uterine cervix, stage IB1 cervical adenocarcinoma. She underwent robotic-assisted radical hysterectomy, bilateral salpingectomy, ureterolysis, bilateral sentinel lymph node mapping and biopsy, repair of serotomy on 06/09/2016.   She presents for surveillance visit. She was last seen on 07/10/2018 with Dr. Glennon Mac. Pap NILM. Exam by Dr. Glennon Mac 3 mm vaginal nodule noted right of midline. Previously palpated as 9-10 mm in our clinic so overall reassuring findings.   Today, she reports feeling well and denies specific complaints.     Gynecologic Oncology Madison [name] is a pleasant [id number] female who is seen in consultation from Dr. Prentice Docker for evaluation of invasive adenocarcinoma of uterine cervix.   04/28/2016 CKC on revealed stage IB1 adenocarcinoma of the cervix.  DIAGNOSIS:  A. CERVIX; COLD KNIFE CONIZATION:  - INVASIVE ENDOCERVICAL ADENOCARCINOMA, USUAL TYPE.  - Moderately differentiated - LYMPH VASCULAR INVASION IS PRESENT.  - THE DEEP AND ECTOCERVICAL MARGINS ARE POSITIVE.   05/16/2016 PET/CT negative for metastatic disease.   She underwent robotic-assisted radical hysterectomy, bilateral salpingectomy, ureterolysis, bilateral sentinel lymph node mapping and biopsy, repair of serotomy on 06/09/2016.   Pathology: Invasive endocervical adenocarcinoma, involving 3:00 to 6:00 and 6:00 to 9:00 quadrants. Tumor invades 5/13 mm.  Mucinous Adenocarcinoma Subtype:Endocervical  Histologic Grade:G2: Moderately differentiated  Tumor Size:Greatest dimension (cm): 1.2 cm  Stromal Invasion:  Depth (mm):8 mm  Horizontal Extent (mm):9 mm   Lymph-Vascular Invasion:Not identified  MARGINS  Margins:Uninvolved by invasive carcinoma  Distance of Invasive Carcinoma from Closest Margin:Specify (mm): 7 mm  Bilateral SLNs negative (0/3)   Paracervical endometriosis.  Bilateral fallopian tubes: Negative for malignancy. Six lymph parametrial nodes negative for metastatic carcinoma. (0/6)  No further therapy recommended.   Reviewed findings from [facility] trial demonstrating worse survival outcomes with MIS radical hysterectomy compared to open procedures. The outcomes for tumors <2 cm, which is her situation, was reassuring. I recommended continued close surveillance.  09/13/17- Pap - NILM, HPV negative.   She has been NED. Reports normal intercourse. She has moved to [place] but wishes to continue to keep her care here in [place].   Problem List: Patient Active Problem List   Diagnosis Date Noted  . Family history of ovarian cancer 07/10/2018  . Vaginal discharge 07/20/2016  . Cervical cancer, FIGO stage IB1 ([place]) 07/20/2016  . Anxiety disorder 07/11/2016  . Malignant neoplasm of cervix ([place]) 05/11/2016  . Status post cesarean section 12/08/2015    Past Medical History: Past Medical History:  Diagnosis Date  . Anxiety   . Cancer [facility]) 2017   Cervical  . Depression     Past Surgical History: Past Surgical History:  Procedure Laterality Date  . BREAST ENHANCEMENT SURGERY Bilateral   . BREAST SURGERY    . CERVICAL CONIZATION W/BX N/A 04/28/2016   Procedure: CONIZATION CERVIX WITH BIOPSY;  Surgeon: Will Bonnet, MD;  Location: [facility] ORS;  Service: Gynecology;  Laterality: N/A;  . CESAREAN SECTION    . CESAREAN SECTION WITH BILATERAL TUBAL LIGATION Bilateral 12/08/2015   Procedure: CESAREAN SECTION WITH BILATERAL TUBAL LIGATION;  Surgeon: Will Bonnet, MD;  Location: [facility] ORS;  Service: Obstetrics;  Laterality: Bilateral;  . DILATION AND CURETTAGE OF UTERUS    . ROBOTIC ASSISTED LAPAROSCOPIC  HYSTERECTOMY AND SALPINGECTOMY  2017  Past Gynecologic History:  Menarche: 12 Menstrual details: n/a Menses regular: n/a Last Menstrual Period: n/a s/p hysterectomy History of Abnormal pap: See HPI Contraception: bilateral tubal ligation Sexually active: yes  OB History:  OB History  Gravida Para Term Preterm AB Living  7 4 4   3 4   SAB TAB Ectopic Multiple Live Births  3     0 4    # Outcome Date GA Lbr Len/2nd Weight Sex Delivery Anes PTL Lv  7 Term 12/08/15 [redacted]w[redacted]d  9 lb 3.8 oz (4.19 kg) M CS-Vac Spinal  LIV  6 SAB 09/2014          5 Term 01/28/13     CS-LTranv   LIV  4 SAB 07/2011          3 Term 12/15/10     CS-LTranv   LIV  2 SAB 04/2008          1 Term 07/13/98     Vag-Spont   LIV    Family History: Family History  Problem Relation Age of Onset  . Diabetes Mellitus II Father   . Lung cancer Maternal Grandmother   . Ovarian cancer Paternal Grandmother   . Cervical cancer Mother     Social History: Social History   Socioeconomic History  . Marital status: Married    Spouse name: Not on file  . Number of children: Not on file  . Years of education: Not on file  . Highest education level: Not on file  Occupational History  . Not on file  Social Needs  . Financial resource strain: Not on file  . Food insecurity:    Worry: Not on file    Inability: Not on file  . Transportation needs:    Medical: Not on file    Non-medical: Not on file  Tobacco Use  . Smoking status: Never Smoker  . Smokeless tobacco: Never Used  Substance and Sexual Activity  . Alcohol use: No  . Drug use: No  . Sexual activity: Yes    Birth control/protection: Surgical  Lifestyle  . Physical activity:    Days per week: Not on file    Minutes per session: Not on file  . Stress: Not on file  Relationships  . Social connections:    Talks on phone: Not on file    Gets together: Not on file    Attends religious service: Not on file    Active member of club or organization: Not  on file    Attends meetings of clubs or organizations: Not on file    Relationship status: Not on file  . Intimate partner violence:    Fear of current or ex partner: Not on file    Emotionally abused: Not on file    Physically abused: Not on file    Forced sexual activity: Not on file  Other Topics Concern  . Not on file  Social History Narrative  . Not on file    Allergies: Allergies  Allergen Reactions  . Tape Other (See Comments)    Burns skin    Current Medications: Current Outpatient Medications  Medication Sig Dispense Refill  . acetaminophen (TYLENOL) 325 MG tablet Take by mouth.    . cyclobenzaprine (FLEXERIL) 10 MG tablet Take 1 tablet (10 mg total) by mouth 3 (three) times daily as needed for muscle spasms. (Patient not taking: Reported on 07/10/2018) 45 tablet 0   No current facility-administered medications for this visit.  Review of Systems General:  no complaints Skin: no complaints Eyes: no complaints HEENT: no complaints Breasts: no complaints Pulmonary: no complaints Cardiac: no complaints Gastrointestinal: no complaints Genitourinary/Sexual: no complaints Ob/Gyn: no complaints Musculoskeletal: no complaints Hematology: no complaints Neurologic/Psych: no complaints   Objective:  Physical Examination:   Today's Vitals   10/17/18 1144  BP: 125/87  Pulse: 83  Resp: 18  Temp: (!) 97.1 F (36.2 C)  TempSrc: Tympanic  Weight: 186 lb 3.2 oz (84.5 kg)  PainSc: 0-No pain   Body mass index is 34.06 kg/m.  GENERAL: Patient is a well appearing female in no acute distress HEENT:  Sclera clear. Anicteric NODES:  Negative axillary, supraclavicular, inguinal lymph node survery LUNGS:  Clear to auscultation bilaterally.   HEART:  Regular rate and rhythm.  ABDOMEN:  Soft, nontender.  No hernias, incisions well healed. No masses or ascites EXTREMITIES:  No peripheral edema. Atraumatic. No cyanosis SKIN:  Clear with no obvious rashes or skin  changes.  NEURO:  Nonfocal. Well oriented.  Appropriate affect.  Pelvic: exam chaperoned by NP;  Vulva: normal appearing vulva with no masses, tenderness or lesions; Vagina: normal vagina; Uterus: surgically absent, vaginal cuff well healed; Cervix: absent; BME Adnexa: not enlarged and vaginal nodularity was ~10 mm which is similar to prior exam, except last exam was  < 9mm on palpation, Rectal: deferred.    Lab Review: n/a  Radiologic Imaging: n/a    Assessment:  Madison [name] is a 38 y.o. female diagnosed with probably stage IB1cervical cancer, adenocarcinoma, grade 2, s/p radical hysterectomy, bilateral SLN negative margins, negative parameteria and negative LVSI.    Vaginal nodule of uncertain significance, suspect exam differences for size measurements, but overall not increasing compared to ~9-10 mm on an exam and negative Pap.    Medical co-morbidities complicating care: obesity and prior abdominal surgery. Body mass index is 34.06 kg/m.  Plan:   Problem List Items Addressed This Visit      Other   Cervical cancer, FIGO stage IB1 ([place]) - Primary      No obvious evidence of disease. Vaginal nodule still present and size varies based on exams. Pap NILM is reassuring.  Close follow up still is warranted.   Continue alternating exams with Dr. Glennon Mac on a every 3 month schedule given vaginal nodule. She will see him in May and RTC with [place] in 6 months.   Overall weight is stable.   Survivorship plan previously discussed at her postoperative visit as noted below. If NED I have recommended continued close follow up with exams, including pelvic exams every 3-6 months for 2 years, then every 6-12 months for 3-5 years and then annually thereafter. Cervical/vaginal cytology may be considered annually, but is thought to have limited value for detecting recurrent cervical cancer. Imaging and laboratory assessment is based on clinical indication. Patient education for obesity,  lifestyle, exercise, nutrition,sexual health, vaginal dilator use, vaginal lubricants/moisturizers was provided.   The patient's diagnosis, an outline of the further diagnostic and laboratory studies which will be required, the recommendation, and alternatives were discussed.  All questions were answered to the patient's satisfaction.  A total of 25 minutes were spent with the patient/family today; >50% was spent in education, counseling and coordination of care for cervical cancer.    Verlon Au, NP   I personally had a face to face interaction and evaluated the patient jointly with the NP, Ms. Beckey Rutter.  I have reviewed her history and available records  and have performed the key portions of the physical exam including general, HEENT, abdominal exam, and pelvic exam with my findings confirming those documented above by the APP.  I have discussed the case with the APP and the patient.  I agree with the above documentation, assessment and plan which was fully formulated by me.  Counseling was completed by me.   I personally saw the patient and performed a substantive portion of this encounter in conjunction with the listed APP as documented above.  Koby Hartfield Gaetana Michaelis, MD    CC:  Dr. Prentice Docker

## 2019-01-22 ENCOUNTER — Ambulatory Visit (INDEPENDENT_AMBULATORY_CARE_PROVIDER_SITE_OTHER): Payer: BLUE CROSS/BLUE SHIELD | Admitting: Obstetrics and Gynecology

## 2019-01-22 ENCOUNTER — Encounter: Payer: Self-pay | Admitting: Obstetrics and Gynecology

## 2019-01-22 ENCOUNTER — Other Ambulatory Visit: Payer: Self-pay

## 2019-01-22 VITALS — BP 122/74 | Ht 62.0 in | Wt 195.0 lb

## 2019-01-22 DIAGNOSIS — C538 Malignant neoplasm of overlapping sites of cervix uteri: Secondary | ICD-10-CM

## 2019-01-22 NOTE — Progress Notes (Signed)
Gynecology Annual Exam  PCP: Valerie Roys, DO   Chief Complaint  Patient presents with  . Follow-up  Surveillance exam for cervical cancer  History of Present Illness:  [name] is a 39 y.o. [id number] who LMP was Patient's last menstrual period was 05/07/2016 (exact date)., presents today for surveillance examination Stage 1B1 adenocarcinoma of the cervix.  Her menses are absent due to hysterectomy.  She has a history of cervical cancer and is status post robot assisted radical hysterectomy. 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  She underwent a radical hysterectomy, bilateral salpingectomy.  Review at [facility] tumor board resulted in the recommendation for no further treatment (no radiation or chemotherapy).  She is being monitored by Dr. Theora Gianotti from [place], alternative surveillance visits with me, currently every three months. Today she specifically denies vaginal bleeding, lower abdominal pain, lateral back pain, bloating, early satiety, cough, trouble breathing, headaches, unintended weight loss.   Past Medical History:  Diagnosis Date  . Anxiety   . Cancer [name]) 2017   Cervical  . Depression     Past Surgical History:  Procedure Laterality Date  . BREAST ENHANCEMENT SURGERY Bilateral   . BREAST SURGERY    . CERVICAL CONIZATION W/BX N/A 04/28/2016   Procedure: CONIZATION CERVIX WITH BIOPSY;  Surgeon: Will Bonnet, MD;  Location: [facility] ORS;  Service: Gynecology;  Laterality: N/A;  . CESAREAN SECTION    . CESAREAN SECTION WITH BILATERAL TUBAL LIGATION Bilateral 12/08/2015   Procedure: CESAREAN SECTION WITH BILATERAL TUBAL LIGATION;  Surgeon: Will Bonnet, MD;  Location: [facility] ORS;  Service: Obstetrics;  Laterality: Bilateral;  . DILATION AND CURETTAGE OF UTERUS    . ROBOTIC ASSISTED LAPAROSCOPIC HYSTERECTOMY AND SALPINGECTOMY  2017    Prior to Admission medications: denies     Allergies  Allergen Reactions  . Tape Other (See Comments)    Burns skin   Gynecologic History: Patient's last menstrual period was 05/07/2016 (exact date).  Obstetric History: [id number]  Social History   Socioeconomic History  . Marital status: Married    Spouse name: Not on file  . Number of children: Not on file  . Years of education: Not on file  . Highest education level: Not on file  Occupational History  . Not on file  Social Needs  . Financial resource strain: Not on file  . Food insecurity:    Worry: Not on file    Inability: Not on file  . Transportation needs:    Medical: Not on file    Non-medical: Not on file  Tobacco Use  . Smoking status: Never Smoker  . Smokeless tobacco: Never Used  Substance and Sexual Activity  . Alcohol use: No  . Drug use: No  . Sexual activity: Yes    Birth control/protection: Surgical  Lifestyle  . Physical activity:    Days per week: Not on file    Minutes per session: Not on file  . Stress: Not on file  Relationships  . Social connections:    Talks on phone: Not on file    Gets together: Not on file    Attends religious service: Not on file    Active member of club or organization: Not on file    Attends meetings of clubs or organizations: Not on file    Relationship status: Not on file  . Intimate partner violence:    Fear of current or  ex partner: Not on file    Emotionally abused: Not on file    Physically abused: Not on file    Forced sexual activity: Not on file  Other Topics Concern  . Not on file  Social History Narrative  . Not on file    Family History  Problem Relation Age of Onset  . Diabetes Mellitus II Father   . Lung cancer Maternal Grandmother   . Ovarian cancer Paternal Grandmother   . Cervical cancer Mother     Review of Systems  Constitutional: Negative.   HENT: Negative.   Eyes: Negative.   Respiratory: Negative.   Cardiovascular: Negative.   Gastrointestinal: Negative.    Genitourinary: Negative.   Musculoskeletal: Negative.   Skin: Negative.   Neurological: Negative.   Psychiatric/Behavioral: Negative.      Physical Exam BP 122/74   Ht 5\' 2"  (1.575 m)   Wt 195 lb (88.5 kg)   LMP 05/07/2016 (Exact Date)   BMI 35.67 kg/m    Physical Exam Constitutional:      General: She is not in acute distress.    Appearance: She is well-developed.  Genitourinary:     Pelvic exam was performed with patient in the lithotomy position.     Vulva, inguinal canal, urethra and bladder normal.     No signs of injury in the vagina.     No vaginal discharge, erythema, tenderness or bleeding.     Cervix is absent.     Uterus is absent.     No right or left adnexal mass present.     Right adnexa not tender or full.     Left adnexa not tender or full.     Genitourinary Comments: Vaginal cuff intact. An approximately ~3 mm nodule noted just right of midline, especially on palpation.  If there is a larger nodularity (there is a discrepancy between my exam and gyn onc who sees ~93mm), it could be the way the vagina has healed and the way cuff appears after closure, as I do not feel firmness in this area, but do appreciate an area that could be construed as a nodule to inspection (and it may be). This who area appears unchanged to me from November 2019.  Rectovaginal deferred  HENT:     Head: Normocephalic and atraumatic.  Eyes:     General: No scleral icterus. Neck:     Musculoskeletal: Normal range of motion and neck supple.     Thyroid: No thyromegaly.  Cardiovascular:     Rate and Rhythm: Normal rate and regular rhythm.     Heart sounds: No murmur. No friction rub. No gallop.   Pulmonary:     Effort: Pulmonary effort is normal. No respiratory distress.     Breath sounds: Normal breath sounds. No wheezing or rales.  Chest:     Breasts:        Right: No inverted nipple, mass, nipple discharge, skin change or tenderness.        Left: No inverted nipple, mass, nipple  discharge, skin change or tenderness.  Abdominal:     General: Bowel sounds are normal. There is no distension.     Palpations: Abdomen is soft. There is no mass.     Tenderness: There is no abdominal tenderness. There is no guarding or rebound.  Musculoskeletal: Normal range of motion.        General: No tenderness.  Lymphadenopathy:     Cervical: No cervical adenopathy.     Lower  Body: No right inguinal adenopathy. No left inguinal adenopathy.  Neurological:     Mental Status: She is alert and oriented to person, place, and time.     Cranial Nerves: No cranial nerve deficit.  Skin:    General: Skin is warm and dry.     Findings: No erythema or rash.  Psychiatric:        Behavior: Behavior normal.        Judgment: Judgment normal.     Female chaperone present for pelvic and breast  portions of the physical exam  Assessment: 38 y.o. [id number] female here for routine annual gynecologic examination.  Plan: Problem List Items Addressed This Visit      Genitourinary   Malignant neoplasm of cervix ([place]) - Primary     Family History of ovarian cancer: briefly discussed baseline population risk of ovarian cancer. Her paternal grandmother was diagnosed at a young age (in her 67s).  Discussed consideration for genetic mutation screening. I will defer to Dr. Theora Gianotti when she visits with her in 3 months to have any necessary further discussion of significance and consideration of testing.   Follow up in 3 months with Dr. Theora Gianotti.  Surveillance schedule per Dr. Theora Gianotti.  Annual exam in 6 months with me.   Prentice Docker, MD 01/22/2019 11:00 AM

## 2019-03-03 NOTE — Progress Notes (Deleted)
   LMP 05/07/2016 (Exact Date)    Subjective:    Patient ID: Madison [name], female    DOB: [birth year], 38 y.o.   MRN: [id number]  HPI: Madison [name] is a 38 y.o. female  No chief complaint on file.  ANXIETY/STRESS Duration:{Blank single:19197::"controlled","uncontrolled","better","worse","exacerbated","stable"} Anxious mood: {Blank single:19197::"yes","no"}  Excessive worrying: {Blank single:19197::"yes","no"} Irritability: {Blank single:19197::"yes","no"}  Sweating: {Blank single:19197::"yes","no"} Nausea: {Blank single:19197::"yes","no"} Palpitations:{Blank single:19197::"yes","no"} Hyperventilation: {Blank single:19197::"yes","no"} Panic attacks: {Blank single:19197::"yes","no"} Agoraphobia: {Blank single:19197::"yes","no"}  Obscessions/compulsions: {Blank single:19197::"yes","no"} Depressed mood: {Blank single:19197::"yes","no"} Depression screen [facility] 2/9 07/10/2018 09/16/2016 08/17/2016 07/11/2016  Decreased Interest 0 0 1 0  Down, Depressed, Hopeless 0 0 1 0  PHQ - 2 Score 0 0 2 0  Altered sleeping 0 0 - -  Tired, decreased energy 0 0 - -  Change in appetite 0 0 - -  Feeling bad or failure about yourself  0 0 - -  Trouble concentrating 0 0 - -  Moving slowly or fidgety/restless 0 0 - -  Suicidal thoughts 0 0 - -  PHQ-9 Score 0 0 - -  Difficult doing work/chores Not difficult at all - - -   Anhedonia: {Blank single:19197::"yes","no"} Weight changes: {Blank single:19197::"yes","no"} Insomnia: {Blank single:19197::"yes","no"} {Blank single:19197::"hard to fall asleep","hard to stay asleep"}  Hypersomnia: {Blank single:19197::"yes","no"} Fatigue/loss of energy: {Blank single:19197::"yes","no"} Feelings of worthlessness: {Blank single:19197::"yes","no"} Feelings of guilt: {Blank single:19197::"yes","no"} Impaired concentration/indecisiveness: {Blank single:19197::"yes","no"} Suicidal ideations: {Blank single:19197::"yes","no"}  Crying spells: {Blank  single:19197::"yes","no"} Recent Stressors/Life Changes: {Blank single:19197::"yes","no"}   Relationship problems: {Blank single:19197::"yes","no"}   Family stress: {Blank single:19197::"yes","no"}     Financial stress: {Blank single:19197::"yes","no"}    Job stress: {Blank single:19197::"yes","no"}    Recent death/loss: {Blank single:19197::"yes","no"}  Relevant past medical, surgical, family and social history reviewed and updated as indicated. Interim medical history since our last visit reviewed. Allergies and medications reviewed and updated.  Review of Systems  Per HPI unless specifically indicated above     Objective:    LMP 05/07/2016 (Exact Date)   Wt Readings from Last 3 Encounters:  01/22/19 195 lb (88.5 kg)  10/17/18 186 lb 3.2 oz (84.5 kg)  07/10/18 185 lb (83.9 kg)    Physical Exam  Results for orders placed or performed in visit on 07/10/18  Cytology - PAP  Result Value Ref Range   Adequacy Satisfactory for evaluation.    Diagnosis      NEGATIVE FOR INTRAEPITHELIAL LESIONS OR MALIGNANCY.   HPV NOT DETECTED    Material Submitted Vaginal Pap [ThinPrep Imaged]    CYTOLOGY - PAP PAP RESULT       Assessment & Plan:   Problem List Items Addressed This Visit      Other   Anxiety disorder - Primary       Follow up plan: No follow-ups on file.

## 2019-03-04 ENCOUNTER — Ambulatory Visit: Payer: BC Managed Care – PPO | Admitting: Family Medicine

## 2019-03-04 ENCOUNTER — Encounter: Payer: Self-pay | Admitting: Family Medicine

## 2019-03-04 ENCOUNTER — Other Ambulatory Visit: Payer: Self-pay

## 2019-03-04 VITALS — BP 119/81 | HR 68 | Temp 98.5°F | Ht 62.0 in | Wt 194.0 lb

## 2019-03-04 DIAGNOSIS — Z0000 Encounter for general adult medical examination without abnormal findings: Secondary | ICD-10-CM | POA: Diagnosis not present

## 2019-03-04 DIAGNOSIS — F411 Generalized anxiety disorder: Secondary | ICD-10-CM | POA: Diagnosis not present

## 2019-03-04 LAB — UA/M W/RFLX CULTURE, ROUTINE
Bilirubin, UA: NEGATIVE
Glucose, UA: NEGATIVE
Ketones, UA: NEGATIVE
Leukocytes,UA: NEGATIVE
Nitrite, UA: NEGATIVE
Protein,UA: NEGATIVE
RBC, UA: NEGATIVE
Specific Gravity, UA: 1.015 (ref 1.005–1.030)
Urobilinogen, Ur: 0.2 mg/dL (ref 0.2–1.0)
pH, UA: 7 (ref 5.0–7.5)

## 2019-03-04 LAB — BAYER DCA HB A1C WAIVED: HB A1C (BAYER DCA - WAIVED): 5.4 % (ref <7.0)

## 2019-03-04 MED ORDER — ESCITALOPRAM OXALATE 5 MG PO TABS: 5.0000 mg | ORAL_TABLET | Freq: Every day | ORAL | 3 refills | Status: DC

## 2019-03-04 NOTE — Patient Instructions (Signed)
Health Maintenance, Female Adopting a healthy lifestyle and getting preventive care are important in promoting health and wellness. Ask your health care provider about:  The right schedule for you to have regular tests and exams.  Things you can do on your own to prevent diseases and keep yourself healthy. What should I know about diet, weight, and exercise? Eat a healthy diet   Eat a diet that includes plenty of vegetables, fruits, low-fat dairy products, and lean protein.  Do not eat a lot of foods that are high in solid fats, added sugars, or sodium. Maintain a healthy weight Body mass index (BMI) is used to identify weight problems. It estimates body fat based on height and weight. Your health care provider can help determine your BMI and help you achieve or maintain a healthy weight. Get regular exercise Get regular exercise. This is one of the most important things you can do for your health. Most adults should:  Exercise for at least 150 minutes each week. The exercise should increase your heart rate and make you sweat (moderate-intensity exercise).  Do strengthening exercises at least twice a week. This is in addition to the moderate-intensity exercise.  Spend less time sitting. Even light physical activity can be beneficial. Watch cholesterol and blood lipids Have your blood tested for lipids and cholesterol at 37 years of age, then have this test every 5 years. Have your cholesterol levels checked more often if:  Your lipid or cholesterol levels are high.  You are older than 37 years of age.  You are at high risk for heart disease. What should I know about cancer screening? Depending on your health history and family history, you may need to have cancer screening at various ages. This may include screening for:  Breast cancer.  Cervical cancer.  Colorectal cancer.  Skin cancer.  Lung cancer. What should I know about heart disease, diabetes, and high blood  pressure? Blood pressure and heart disease  High blood pressure causes heart disease and increases the risk of stroke. This is more likely to develop in people who have high blood pressure readings, are of African descent, or are overweight.  Have your blood pressure checked: ? Every 3-5 years if you are 18-39 years of age. ? Every year if you are 40 years old or older. Diabetes Have regular diabetes screenings. This checks your fasting blood sugar level. Have the screening done:  Once every three years after age 40 if you are at a normal weight and have a low risk for diabetes.  More often and at a younger age if you are overweight or have a high risk for diabetes. What should I know about preventing infection? Hepatitis B If you have a higher risk for hepatitis B, you should be screened for this virus. Talk with your health care provider to find out if you are at risk for hepatitis B infection. Hepatitis C Testing is recommended for:  Everyone born from 1945 through 1965.  Anyone with known risk factors for hepatitis C. Sexually transmitted infections (STIs)  Get screened for STIs, including gonorrhea and chlamydia, if: ? You are sexually active and are younger than 37 years of age. ? You are older than 37 years of age and your health care provider tells you that you are at risk for this type of infection. ? Your sexual activity has changed since you were last screened, and you are at increased risk for chlamydia or gonorrhea. Ask your health care provider if   you are at risk.  Ask your health care provider about whether you are at high risk for HIV. Your health care provider may recommend a prescription medicine to help prevent HIV infection. If you choose to take medicine to prevent HIV, you should first get tested for HIV. You should then be tested every 3 months for as long as you are taking the medicine. Pregnancy  If you are about to stop having your period (premenopausal) and  you may become pregnant, seek counseling before you get pregnant.  Take 400 to 800 micrograms (mcg) of folic acid every day if you become pregnant.  Ask for birth control (contraception) if you want to prevent pregnancy. Osteoporosis and menopause Osteoporosis is a disease in which the bones lose minerals and strength with aging. This can result in bone fractures. If you are 65 years old or older, or if you are at risk for osteoporosis and fractures, ask your health care provider if you should:  Be screened for bone loss.  Take a calcium or vitamin D supplement to lower your risk of fractures.  Be given hormone replacement therapy (HRT) to treat symptoms of menopause. Follow these instructions at home: Lifestyle  Do not use any products that contain nicotine or tobacco, such as cigarettes, e-cigarettes, and chewing tobacco. If you need help quitting, ask your health care provider.  Do not use street drugs.  Do not share needles.  Ask your health care provider for help if you need support or information about quitting drugs. Alcohol use  Do not drink alcohol if: ? Your health care provider tells you not to drink. ? You are pregnant, may be pregnant, or are planning to become pregnant.  If you drink alcohol: ? Limit how much you use to 0-1 drink a day. ? Limit intake if you are breastfeeding.  Be aware of how much alcohol is in your drink. In the [place] one drink equals one 12 oz bottle of beer (355 mL), one 5 oz glass of wine (148 mL), or one 1 oz glass of hard liquor (44 mL). General instructions  Schedule regular health, dental, and eye exams.  Stay current with your vaccines.  Tell your health care provider if: ? You often feel depressed. ? You have ever been abused or do not feel safe at home. Summary  Adopting a healthy lifestyle and getting preventive care are important in promoting health and wellness.  Follow your health care provider's instructions about healthy  diet, exercising, and getting tested or screened for diseases.  Follow your health care provider's instructions on monitoring your cholesterol and blood pressure. This information is not intended to replace advice given to you by your health care provider. Make sure you discuss any questions you have with your health care provider. Document Released: 02/21/2011 Document Revised: 08/01/2018 Document Reviewed: 08/01/2018 [employer]vier Patient Education  2020 [employer]  

## 2019-03-04 NOTE — Progress Notes (Signed)
BP 119/81   Pulse 68   Temp 98.5 F (36.9 C) (Oral)   Ht 5\' 2"  (1.575 m)   Wt 194 lb (88 kg)   LMP 05/07/2016 (Exact Date)   SpO2 98%   BMI 35.48 kg/m    Subjective:    Patient ID: Madison [name], female    DOB: 14-Aug-1981, 38 y.o.   MRN: [id number]  HPI: Madison [name] is a 38 y.o. female presenting on 03/04/2019 for comprehensive medical examination. Current medical complaints include:  ANXIETY/STRESS- did well on the lexapro, but went off of it and didn't do quite as well.  Duration:exacerbated Anxious mood: yes  Excessive worrying: no Irritability: yes  Sweating: yes Nausea: no Palpitations:yes- with exertion Hyperventilation: no Panic attacks: no Agoraphobia: no  Obscessions/compulsions: no Depressed mood: no Depression screen [facility] 2/9 03/04/2019 07/10/2018 09/16/2016 08/17/2016 07/11/2016  Decreased Interest 0 0 0 1 0  Down, Depressed, Hopeless 0 0 0 1 0  PHQ - 2 Score 0 0 0 2 0  Altered sleeping 0 0 0 - -  Tired, decreased energy 1 0 0 - -  Change in appetite 3 0 0 - -  Feeling bad or failure about yourself  0 0 0 - -  Trouble concentrating 0 0 0 - -  Moving slowly or fidgety/restless 0 0 0 - -  Suicidal thoughts 0 0 0 - -  PHQ-9 Score 4 0 0 - -  Difficult doing work/chores Somewhat difficult Not difficult at all - - -   Anhedonia: no Weight changes: no Insomnia: no   Hypersomnia: no Fatigue/loss of energy: no Feelings of worthlessness: no Feelings of guilt: no Impaired concentration/indecisiveness: no Suicidal ideations: no  Crying spells: no Recent Stressors/Life Changes: yes   Relationship problems: no   Family stress: yes     Financial stress: no    Job stress: no    Recent death/loss: no  She currently lives with: sons and husband Menopausal Symptoms: no  Depression Screen done today and results listed below:  Depression screen [facility] 2/9 03/04/2019 07/10/2018 09/16/2016 08/17/2016 07/11/2016  Decreased Interest 0 0 0 1 0  Down, Depressed,  Hopeless 0 0 0 1 0  PHQ - 2 Score 0 0 0 2 0  Altered sleeping 0 0 0 - -  Tired, decreased energy 1 0 0 - -  Change in appetite 3 0 0 - -  Feeling bad or failure about yourself  0 0 0 - -  Trouble concentrating 0 0 0 - -  Moving slowly or fidgety/restless 0 0 0 - -  Suicidal thoughts 0 0 0 - -  PHQ-9 Score 4 0 0 - -  Difficult doing work/chores Somewhat difficult Not difficult at all - - -    Past Medical History:  Past Medical History:  Diagnosis Date  . Anxiety   . Cancer [facility]) 2017   Cervical  . Depression     Surgical History:  Past Surgical History:  Procedure Laterality Date  . BREAST ENHANCEMENT SURGERY Bilateral   . BREAST SURGERY    . CERVICAL CONIZATION W/BX N/A 04/28/2016   Procedure: CONIZATION CERVIX WITH BIOPSY;  Surgeon: Will Bonnet, MD;  Location: [facility] ORS;  Service: Gynecology;  Laterality: N/A;  . CESAREAN SECTION    . CESAREAN SECTION WITH BILATERAL TUBAL LIGATION Bilateral 12/08/2015   Procedure: CESAREAN SECTION WITH BILATERAL TUBAL LIGATION;  Surgeon: Will Bonnet, MD;  Location: [facility] ORS;  Service: Obstetrics;  Laterality: Bilateral;  .  DILATION AND CURETTAGE OF UTERUS    . ROBOTIC ASSISTED LAPAROSCOPIC HYSTERECTOMY AND SALPINGECTOMY  2017    Medications:  Current Outpatient Medications on File Prior to Visit  Medication Sig  . acetaminophen (TYLENOL) 325 MG tablet Take by mouth.   No current facility-administered medications on file prior to visit.     Allergies:  Allergies  Allergen Reactions  . Tape Other (See Comments)    Burns skin    Social History:  Social History   Socioeconomic History  . Marital status: Married    Spouse name: Not on file  . Number of children: Not on file  . Years of education: Not on file  . Highest education level: Not on file  Occupational History  . Not on file  Social Needs  . Financial resource strain: Not on file  . Food insecurity    Worry: Not on file    Inability: Not on file  .  Transportation needs    Medical: Not on file    Non-medical: Not on file  Tobacco Use  . Smoking status: Never Smoker  . Smokeless tobacco: Never Used  Substance and Sexual Activity  . Alcohol use: No  . Drug use: No  . Sexual activity: Yes    Birth control/protection: Surgical  Lifestyle  . Physical activity    Days per week: Not on file    Minutes per session: Not on file  . Stress: Not on file  Relationships  . Social Herbalist on phone: Not on file    Gets together: Not on file    Attends religious service: Not on file    Active member of club or organization: Not on file    Attends meetings of clubs or organizations: Not on file    Relationship status: Not on file  . Intimate partner violence    Fear of current or ex partner: Not on file    Emotionally abused: Not on file    Physically abused: Not on file    Forced sexual activity: Not on file  Other Topics Concern  . Not on file  Social History Narrative  . Not on file   Social History   Tobacco Use  Smoking Status Never Smoker  Smokeless Tobacco Never Used   Social History   Substance and Sexual Activity  Alcohol Use No    Family History:  Family History  Problem Relation Age of Onset  . Diabetes Mellitus II Father   . Lung cancer Maternal Grandmother   . Ovarian cancer Paternal Grandmother   . Cervical cancer Mother     Past medical history, surgical history, medications, allergies, family history and social history reviewed with patient today and changes made to appropriate areas of the chart.   Review of Systems  Constitutional: Negative.   HENT: Negative.   Eyes: Negative.   Respiratory: Negative.   Cardiovascular: Positive for palpitations. Negative for chest pain, orthopnea, claudication, leg swelling and PND.  Gastrointestinal: Negative.   Genitourinary: Negative.   Musculoskeletal: Negative.   Skin: Negative.   Neurological: Negative.   Endo/Heme/Allergies: Positive for  polydipsia. Negative for environmental allergies. Does not bruise/bleed easily.  Psychiatric/Behavioral: Negative for depression, hallucinations, memory loss, substance abuse and suicidal ideas. The patient is nervous/anxious. The patient does not have insomnia.     All other ROS negative except what is listed above and in the HPI.      Objective:    BP 119/81   Pulse  68   Temp 98.5 F (36.9 C) (Oral)   Ht 5\' 2"  (1.575 m)   Wt 194 lb (88 kg)   LMP 05/07/2016 (Exact Date)   SpO2 98%   BMI 35.48 kg/m   Wt Readings from Last 3 Encounters:  03/04/19 194 lb (88 kg)  01/22/19 195 lb (88.5 kg)  10/17/18 186 lb 3.2 oz (84.5 kg)    Physical Exam Vitals signs and nursing note reviewed.  Constitutional:      General: She is not in acute distress.    Appearance: Normal appearance. She is not ill-appearing, toxic-appearing or diaphoretic.  HENT:     Head: Normocephalic and atraumatic.     Right Ear: Tympanic membrane, ear canal and external ear normal. There is no impacted cerumen.     Left Ear: Tympanic membrane, ear canal and external ear normal. There is no impacted cerumen.     Nose: Nose normal. No congestion or rhinorrhea.     Mouth/Throat:     Mouth: Mucous membranes are moist.     Pharynx: Oropharynx is clear. No oropharyngeal exudate or posterior oropharyngeal erythema.  Eyes:     General: No scleral icterus.       Right eye: No discharge.        Left eye: No discharge.     Extraocular Movements: Extraocular movements intact.     Conjunctiva/sclera: Conjunctivae normal.     Pupils: Pupils are equal, round, and reactive to light.  Neck:     Musculoskeletal: Normal range of motion and neck supple. No neck rigidity or muscular tenderness.     Vascular: No carotid bruit.  Cardiovascular:     Rate and Rhythm: Normal rate and regular rhythm.     Pulses: Normal pulses.     Heart sounds: No murmur. No friction rub. No gallop.   Pulmonary:     Effort: Pulmonary effort is  normal. No respiratory distress.     Breath sounds: Normal breath sounds. No stridor. No wheezing, rhonchi or rales.  Chest:     Chest wall: No tenderness.  Abdominal:     General: Abdomen is flat. Bowel sounds are normal. There is no distension.     Palpations: Abdomen is soft. There is no mass.     Tenderness: There is no abdominal tenderness. There is no right CVA tenderness, left CVA tenderness, guarding or rebound.     Hernia: No hernia is present.  Genitourinary:    Comments: Breast and pelvic exams deferred with shared decision making Musculoskeletal:        General: No swelling, tenderness, deformity or signs of injury.     Right lower leg: No edema.     Left lower leg: No edema.  Lymphadenopathy:     Cervical: No cervical adenopathy.  Skin:    General: Skin is warm and dry.     Capillary Refill: Capillary refill takes less than 2 seconds.     Coloration: Skin is not jaundiced or pale.     Findings: No bruising, erythema, lesion or rash.  Neurological:     General: No focal deficit present.     Mental Status: She is alert and oriented to person, place, and time. Mental status is at baseline.     Cranial Nerves: No cranial nerve deficit.     Sensory: No sensory deficit.     Motor: No weakness.     Coordination: Coordination normal.     Gait: Gait normal.     Deep Tendon  Reflexes: Reflexes normal.  Psychiatric:        Mood and Affect: Mood normal.        Behavior: Behavior normal.        Thought Content: Thought content normal.        Judgment: Judgment normal.     Results for orders placed or performed in visit on 07/10/18  Cytology - PAP  Result Value Ref Range   Adequacy Satisfactory for evaluation.    Diagnosis      NEGATIVE FOR INTRAEPITHELIAL LESIONS OR MALIGNANCY.   HPV NOT DETECTED    Material Submitted Vaginal Pap [ThinPrep Imaged]    CYTOLOGY - PAP PAP RESULT       Assessment & Plan:   Problem List Items Addressed This Visit      Other   Anxiety  disorder    Will restart lexapro and recheck 1 month. Call with any concerns. Continue to monitor. Call with any concerns.       Relevant Medications   escitalopram (LEXAPRO) 5 MG tablet    Other Visit Diagnoses    Routine general medical examination at a health care facility    -  Primary   Vaccines up to date. Screening labs checked today. Pap through GYN. Continue diet and exercise. Call with any concerns. Continue to monitor.    Relevant Orders   Bayer DCA Hb A1c Waived   CBC with Differential/Platelet   Comprehensive metabolic panel   Lipid Panel w/o Chol/HDL Ratio   TSH   UA/M w/rflx Culture, Routine       Follow up plan: Return in about 4 weeks (around 04/01/2019) for virtual follow up mood.   LABORATORY TESTING:  - Pap smear: up to date- done at GYN  IMMUNIZATIONS:   - Tdap: Tetanus vaccination status reviewed: last tetanus booster within 10 years. - Influenza: Postponed to flu season - Pneumovax: Not applicable  PATIENT COUNSELING:   Advised to take 1 mg of folate supplement per day if capable of pregnancy.   Sexuality: Discussed sexually transmitted diseases, partner selection, use of condoms, avoidance of unintended pregnancy  and contraceptive alternatives.   Advised to avoid cigarette smoking.  I discussed with the patient that most people either abstain from alcohol or drink within safe limits (<=14/week and <=4 drinks/occasion for males, <=7/weeks and <= 3 drinks/occasion for females) and that the risk for alcohol disorders and other health effects rises proportionally with the number of drinks per week and how often a drinker exceeds daily limits.  Discussed cessation/primary prevention of drug use and availability of treatment for abuse.   Diet: Encouraged to adjust caloric intake to maintain  or achieve ideal body weight, to reduce intake of dietary saturated fat and total fat, to limit sodium intake by avoiding high sodium foods and not adding table salt,  and to maintain adequate dietary potassium and calcium preferably from fresh fruits, vegetables, and low-fat dairy products.    stressed the importance of regular exercise  Injury prevention: Discussed safety belts, safety helmets, smoke detector, smoking near bedding or upholstery.   Dental health: Discussed importance of regular tooth brushing, flossing, and dental visits.    NEXT PREVENTATIVE PHYSICAL DUE IN 1 YEAR. Return in about 4 weeks (around 04/01/2019) for virtual follow up mood.

## 2019-03-04 NOTE — Assessment & Plan Note (Signed)
Will restart lexapro and recheck 1 month. Call with any concerns. Continue to monitor. Call with any concerns.

## 2019-03-05 LAB — LIPID PANEL W/O CHOL/HDL RATIO
Cholesterol, Total: 210 mg/dL — ABNORMAL HIGH (ref 100–199)
HDL: 29 mg/dL — ABNORMAL LOW (ref >39)
LDL Calculated: 114 mg/dL — ABNORMAL HIGH (ref 0–99)
Triglycerides: 336 mg/dL — ABNORMAL HIGH (ref 0–149)
VLDL Cholesterol Cal: 67 mg/dL — ABNORMAL HIGH (ref 5–40)

## 2019-03-05 LAB — CBC WITH DIFFERENTIAL/PLATELET
Basophils Absolute: 0.1 10*3/uL (ref 0.0–0.2)
Basos: 1 %
EOS (ABSOLUTE): 0.2 10*3/uL (ref 0.0–0.4)
Eos: 2 %
Hematocrit: 40.5 % (ref 34.0–46.6)
Hemoglobin: 14 g/dL (ref 11.1–15.9)
Immature Grans (Abs): 0 10*3/uL (ref 0.0–0.1)
Immature Granulocytes: 0 %
Lymphocytes Absolute: 2.7 10*3/uL (ref 0.7–3.1)
Lymphs: 36 %
MCH: 29.9 pg (ref 26.6–33.0)
MCHC: 34.6 g/dL (ref 31.5–35.7)
MCV: 87 fL (ref 79–97)
Monocytes Absolute: 0.5 10*3/uL (ref 0.1–0.9)
Monocytes: 6 %
Neutrophils Absolute: 4.2 10*3/uL (ref 1.4–7.0)
Neutrophils: 55 %
Platelets: 330 10*3/uL (ref 150–450)
RBC: 4.68 x10E6/uL (ref 3.77–5.28)
RDW: 12.9 % (ref 11.7–15.4)
WBC: 7.6 10*3/uL (ref 3.4–10.8)

## 2019-03-05 LAB — COMPREHENSIVE METABOLIC PANEL
ALT: 22 IU/L (ref 0–32)
AST: 20 IU/L (ref 0–40)
Albumin/Globulin Ratio: 1.6 (ref 1.2–2.2)
Albumin: 4.1 g/dL (ref 3.8–4.8)
Alkaline Phosphatase: 78 IU/L (ref 39–117)
BUN/Creatinine Ratio: 8 — ABNORMAL LOW (ref 9–23)
BUN: 6 mg/dL (ref 6–20)
Bilirubin Total: 0.3 mg/dL (ref 0.0–1.2)
CO2: 20 mmol/L (ref 20–29)
Calcium: 9.1 mg/dL (ref 8.7–10.2)
Chloride: 106 mmol/L (ref 96–106)
Creatinine, Ser: 0.74 mg/dL (ref 0.57–1.00)
GFR calc Af Amer: 120 mL/min/{1.73_m2} (ref >59)
GFR calc non Af Amer: 104 mL/min/{1.73_m2} (ref >59)
Globulin, Total: 2.5 g/dL (ref 1.5–4.5)
Glucose: 97 mg/dL (ref 65–99)
Potassium: 4.2 mmol/L (ref 3.5–5.2)
Sodium: 136 mmol/L (ref 134–144)
Total Protein: 6.6 g/dL (ref 6.0–8.5)

## 2019-03-05 LAB — TSH: TSH: 1.35 u[IU]/mL (ref 0.450–4.500)

## 2019-04-08 ENCOUNTER — Encounter: Payer: Self-pay | Admitting: Family Medicine

## 2019-04-08 ENCOUNTER — Ambulatory Visit (INDEPENDENT_AMBULATORY_CARE_PROVIDER_SITE_OTHER): Payer: BC Managed Care – PPO | Admitting: Family Medicine

## 2019-04-08 ENCOUNTER — Other Ambulatory Visit: Payer: Self-pay

## 2019-04-08 DIAGNOSIS — F411 Generalized anxiety disorder: Secondary | ICD-10-CM

## 2019-04-08 MED ORDER — ESCITALOPRAM OXALATE 10 MG PO TABS: 10.0000 mg | ORAL_TABLET | Freq: Every day | ORAL | 3 refills | Status: DC

## 2019-04-08 MED ORDER — HYDROXYZINE HCL 10 MG PO TABS: 10.0000 mg | ORAL_TABLET | Freq: Three times a day (TID) | ORAL | 1 refills | Status: DC | PRN

## 2019-04-08 NOTE — Progress Notes (Signed)
LMP 05/07/2016 (Exact Date)    Subjective:    Patient ID: Madison [name], female    DOB: Feb 25, 1981, 38 y.o.   MRN: [id number]  HPI: Madison [name] is a 38 y.o. female  Chief Complaint  Patient presents with  . Anxiety   ANXIETY/STRESS Duration:better Anxious mood: yes  Excessive worrying: yes Irritability: no  Sweating: no Nausea: no Palpitations:no Hyperventilation: no Panic attacks: no Agoraphobia: no  Obscessions/compulsions: yes Depressed mood: no Depression screen [facility] 2/9 04/08/2019 03/04/2019 07/10/2018 09/16/2016 08/17/2016  Decreased Interest 0 0 0 0 1  Down, Depressed, Hopeless 0 0 0 0 1  PHQ - 2 Score 0 0 0 0 2  Altered sleeping 0 0 0 0 -  Tired, decreased energy 0 1 0 0 -  Change in appetite 0 3 0 0 -  Feeling bad or failure about yourself  0 0 0 0 -  Trouble concentrating 0 0 0 0 -  Moving slowly or fidgety/restless 0 0 0 0 -  Suicidal thoughts 0 0 0 0 -  PHQ-9 Score 0 4 0 0 -  Difficult doing work/chores Not difficult at all Somewhat difficult Not difficult at all - -   GAD 7 : Generalized Anxiety Score 04/08/2019 03/04/2019 08/17/2016 07/11/2016  Nervous, Anxious, on Edge 0 3 1 2   Control/stop worrying 0 3 1 2   Worry too much - different things 0 3 1 2   Trouble relaxing 0 2 1 2   Restless 0 0 0 0  Easily annoyed or irritable 0 2 1 3   Afraid - awful might happen 0 1 1 2   Total GAD 7 Score 0 14 6 13   Anxiety Difficulty Not difficult at all Somewhat difficult Somewhat difficult Very difficult   Anhedonia: no Weight changes: no Insomnia: no   Hypersomnia: no Fatigue/loss of energy: no Feelings of worthlessness: no Feelings of guilt: no Impaired concentration/indecisiveness: no Suicidal ideations: no  Crying spells: no Recent Stressors/Life Changes: yes   Relationship problems: no   Family stress: no     Financial stress: no    Job stress: no    Recent death/loss: no  Relevant past medical, surgical, family and social history reviewed and  updated as indicated. Interim medical history since our last visit reviewed. Allergies and medications reviewed and updated.  Review of Systems  Constitutional: Negative.   Respiratory: Negative.   Cardiovascular: Negative.   Musculoskeletal: Negative.   Neurological: Negative.   Psychiatric/Behavioral: Negative for depression, hallucinations, memory loss, substance abuse and suicidal ideas. The patient is nervous/anxious. The patient does not have insomnia.     Per HPI unless specifically indicated above     Objective:    LMP 05/07/2016 (Exact Date)   Wt Readings from Last 3 Encounters:  03/04/19 194 lb (88 kg)  01/22/19 195 lb (88.5 kg)  10/17/18 186 lb 3.2 oz (84.5 kg)    Physical Exam Vitals signs and nursing note reviewed.  Constitutional:      General: She is not in acute distress.    Appearance: Normal appearance. She is not ill-appearing, toxic-appearing or diaphoretic.  HENT:     Head: Normocephalic and atraumatic.     Right Ear: External ear normal.     Left Ear: External ear normal.     Nose: Nose normal.     Mouth/Throat:     Mouth: Mucous membranes are moist.     Pharynx: Oropharynx is clear.  Eyes:     General: No scleral icterus.  Right eye: No discharge.        Left eye: No discharge.     Conjunctiva/sclera: Conjunctivae normal.     Pupils: Pupils are equal, round, and reactive to light.  Neck:     Musculoskeletal: Normal range of motion.  Pulmonary:     Effort: Pulmonary effort is normal. No respiratory distress.     Comments: Speaking in full sentences Musculoskeletal: Normal range of motion.  Skin:    Coloration: Skin is not jaundiced or pale.     Findings: No bruising, erythema, lesion or rash.  Neurological:     Mental Status: She is alert and oriented to person, place, and time. Mental status is at baseline.  Psychiatric:        Mood and Affect: Mood normal.        Behavior: Behavior normal.        Thought Content: Thought content  normal.        Judgment: Judgment normal.     Results for orders placed or performed in visit on 03/04/19  Bayer DCA Hb A1c Waived  Result Value Ref Range   HB A1C (BAYER DCA - WAIVED) 5.4 <7.0 %  CBC with Differential/Platelet  Result Value Ref Range   WBC 7.6 3.4 - 10.8 x10E3/uL   RBC 4.68 3.77 - 5.28 x10E6/uL   Hemoglobin 14.0 11.1 - 15.9 g/dL   Hematocrit 40.5 34.0 - 46.6 %   MCV 87 79 - 97 fL   MCH 29.9 26.6 - 33.0 pg   MCHC 34.6 31.5 - 35.7 g/dL   RDW 12.9 11.7 - 15.4 %   Platelets 330 150 - 450 x10E3/uL   Neutrophils 55 Not Estab. %   Lymphs 36 Not Estab. %   Monocytes 6 Not Estab. %   Eos 2 Not Estab. %   Basos 1 Not Estab. %   Neutrophils Absolute 4.2 1.4 - 7.0 x10E3/uL   Lymphocytes Absolute 2.7 0.7 - 3.1 x10E3/uL   Monocytes Absolute 0.5 0.1 - 0.9 x10E3/uL   EOS (ABSOLUTE) 0.2 0.0 - 0.4 x10E3/uL   Basophils Absolute 0.1 0.0 - 0.2 x10E3/uL   Immature Granulocytes 0 Not Estab. %   Immature Grans (Abs) 0.0 0.0 - 0.1 x10E3/uL  Comprehensive metabolic panel  Result Value Ref Range   Glucose 97 65 - 99 mg/dL   BUN 6 6 - 20 mg/dL   Creatinine, Ser 0.74 0.57 - 1.00 mg/dL   GFR calc non Af Amer 104 >59 mL/min/1.73   GFR calc Af Amer 120 >59 mL/min/1.73   BUN/Creatinine Ratio 8 (L) 9 - 23   Sodium 136 134 - 144 mmol/L   Potassium 4.2 3.5 - 5.2 mmol/L   Chloride 106 96 - 106 mmol/L   CO2 20 20 - 29 mmol/L   Calcium 9.1 8.7 - 10.2 mg/dL   Total Protein 6.6 6.0 - 8.5 g/dL   Albumin 4.1 3.8 - 4.8 g/dL   Globulin, Total 2.5 1.5 - 4.5 g/dL   Albumin/Globulin Ratio 1.6 1.2 - 2.2   Bilirubin Total 0.3 0.0 - 1.2 mg/dL   Alkaline Phosphatase 78 39 - 117 IU/L   AST 20 0 - 40 IU/L   ALT 22 0 - 32 IU/L  Lipid Panel w/o Chol/HDL Ratio  Result Value Ref Range   Cholesterol, Total 210 (H) 100 - 199 mg/dL   Triglycerides 336 (H) 0 - 149 mg/dL   HDL 29 (L) >39 mg/dL   VLDL Cholesterol Cal 67 (H) 5 - 40 mg/dL  LDL Calculated 114 (H) 0 - 99 mg/dL  TSH  Result Value Ref  Range   TSH 1.350 0.450 - 4.500 uIU/mL  UA/M w/rflx Culture, Routine   Specimen: Blood   BLD  Result Value Ref Range   Specific Gravity, UA 1.015 1.005 - 1.030   pH, UA 7.0 5.0 - 7.5   Color, UA Yellow Yellow   Appearance Ur Clear Clear   Leukocytes,UA Negative Negative   Protein,UA Negative Negative/Trace   Glucose, UA Negative Negative   Ketones, UA Negative Negative   RBC, UA Negative Negative   Bilirubin, UA Negative Negative   Urobilinogen, Ur 0.2 0.2 - 1.0 mg/dL   Nitrite, UA Negative Negative      Assessment & Plan:   Problem List Items Addressed This Visit      Other   Anxiety disorder    Doing much better, but still has a bit of intrusive thoughts. Will increase her lexapro to 10mg  and recheck 1 month. Call with any concerns. Hydroxyzine PRN.       Relevant Medications   escitalopram (LEXAPRO) 10 MG tablet   hydrOXYzine (ATARAX/VISTARIL) 10 MG tablet       Follow up plan: Return in about 4 weeks (around 05/06/2019) for follow up anxiety.    . This visit was completed via [employer] due to the restrictions of the COVID-19 pandemic. All issues as above were discussed and addressed. Physical exam was done as above through visual confirmation on Doximity. If it was felt that the patient should be evaluated in the office, they were directed there. The patient verbally consented to this visit. . Location of the patient: home . Location of the provider: work . Those involved with this call:  . Provider: Park Liter, DO . CMA: Lesle Chris, [place] . Front Desk/Registration: Don Perking  . Time spent on call: 15 minutes with patient face to face via video conference. More than 50% of this time was spent in counseling and coordination of care. 23 minutes total spent in review of patient's record and preparation of their chart.

## 2019-04-08 NOTE — Assessment & Plan Note (Signed)
Doing much better, but still has a bit of intrusive thoughts. Will increase her lexapro to 10mg  and recheck 1 month. Call with any concerns. Hydroxyzine PRN.

## 2019-04-17 ENCOUNTER — Ambulatory Visit: Payer: BLUE CROSS/BLUE SHIELD

## 2019-05-01 ENCOUNTER — Other Ambulatory Visit: Payer: Self-pay

## 2019-05-01 ENCOUNTER — Inpatient Hospital Stay: Payer: BC Managed Care – PPO | Attending: Obstetrics and Gynecology | Admitting: Obstetrics and Gynecology

## 2019-05-01 VITALS — BP 122/90 | HR 75 | Temp 98.6°F | Resp 15 | Wt 191.4 lb

## 2019-05-01 DIAGNOSIS — C539 Malignant neoplasm of cervix uteri, unspecified: Secondary | ICD-10-CM

## 2019-05-01 DIAGNOSIS — Z90722 Acquired absence of ovaries, bilateral: Secondary | ICD-10-CM | POA: Diagnosis not present

## 2019-05-01 DIAGNOSIS — Z08 Encounter for follow-up examination after completed treatment for malignant neoplasm: Secondary | ICD-10-CM | POA: Diagnosis present

## 2019-05-01 DIAGNOSIS — Z8541 Personal history of malignant neoplasm of cervix uteri: Secondary | ICD-10-CM | POA: Diagnosis not present

## 2019-05-01 DIAGNOSIS — Z90710 Acquired absence of both cervix and uterus: Secondary | ICD-10-CM

## 2019-05-01 NOTE — Progress Notes (Signed)
Gynecologic Oncology Visit   Referring Provider: Dr. Prentice Docker  Chief Concern: stage IB1 cervical adenocarcinoma, surveillance visit  Subjective:  Madison [name] is a 38 y.o. [id number] female, initially seen in consultation from Dr. Prentice Docker returns to clinic today for continued surveillance of stage IB1 cervical adenocarcinoma s/p robotic-assisted radical hysterectomy, bilateral salpingectomy, ureterolysis, bilateral sentinel lymph node mapping and biopsy, repair of serotomy on 06/09/2016.   In the interim she has seen Dr. Glennon Mac on 01/22/2019. Per his note, vaginal cuff intact. An approximately ~3 mm nodule noted just right of midline, especially on palpation.  If there is a larger nodularity (there is a discrepancy between my exam and gyn onc who sees ~67mm), it could be the way the vagina has healed and the way cuff appears after closure, as I do not feel firmness in this area, but do appreciate an area that could be construed as a nodule to inspection (and it may be). This who area appears unchanged to me from November 2019.  Rectovaginal deferred   Last pap was 07/10/2018- NILM, HPV negative.   Today, she reports feeling well and denies specific complaints.    Gynecologic Oncology Madison [name] is a pleasant [id number] female who is seen in consultation from Dr. Prentice Docker for evaluation of invasive adenocarcinoma of uterine cervix.   04/28/2016 CKC on revealed stage IB1 adenocarcinoma of the cervix.  DIAGNOSIS:  A. CERVIX; COLD KNIFE CONIZATION:  - INVASIVE ENDOCERVICAL ADENOCARCINOMA, USUAL TYPE.  - Moderately differentiated - LYMPH VASCULAR INVASION IS PRESENT.  - THE DEEP AND ECTOCERVICAL MARGINS ARE POSITIVE.   05/16/2016 PET/CT negative for metastatic disease.   She underwent robotic-assisted radical hysterectomy, bilateral salpingectomy, ureterolysis, bilateral sentinel lymph node mapping and biopsy, repair of serotomy on 06/09/2016.   Pathology: Invasive  endocervical adenocarcinoma, involving 3:00 to 6:00 and 6:00 to 9:00 quadrants. Tumor invades 5/13 mm.  Mucinous Adenocarcinoma Subtype:Endocervical  Histologic Grade:G2: Moderately differentiated  Tumor Size:Greatest dimension (cm): 1.2 cm  Stromal Invasion:  Depth (mm):8 mm  Horizontal Extent (mm):9 mm  Lymph-Vascular Invasion:Not identified  MARGINS  Margins:Uninvolved by invasive carcinoma  Distance of Invasive Carcinoma from Closest Margin:Specify (mm): 7 mm  Bilateral SLNs negative (0/3)   Paracervical endometriosis.  Bilateral fallopian tubes: Negative for malignancy. Six lymph parametrial nodes negative for metastatic carcinoma. (0/6)  No further therapy recommended.   Reviewed findings from [facility] trial demonstrating worse survival outcomes with MIS radical hysterectomy compared to open procedures. The outcomes for tumors <2 cm, which is her situation, was reassuring. I recommended continued close surveillance.  09/13/17- Pap - NILM, HPV negative.   She has been NED. Reports normal intercourse. She has moved to [place] but wishes to continue to keep her care here in [place].    She was last seen on 07/10/2018 with Dr. Glennon Mac. Pap NILM. Exam by Dr. Glennon Mac 3 mm vaginal nodule noted right of midline. Previously palpated as 9-10 mm in our clinic so overall reassuring findings.    Problem List: Patient Active Problem List   Diagnosis Date Noted  . Family history of ovarian cancer 07/10/2018  . Cervical cancer, FIGO stage IB1 ([place]) 07/20/2016  . Anxiety disorder 07/11/2016  . Malignant neoplasm of cervix ([place]) 05/11/2016    Past Medical History: Past Medical History:  Diagnosis Date  . Anxiety   . Cancer [facility]) 2017   Cervical  . Depression     Past Surgical History: Past Surgical History:  Procedure Laterality Date  . BREAST ENHANCEMENT SURGERY Bilateral   .  BREAST SURGERY    . CERVICAL CONIZATION  W/BX N/A 04/28/2016   Procedure: CONIZATION CERVIX WITH BIOPSY;  Surgeon: Will Bonnet, MD;  Location: [facility] ORS;  Service: Gynecology;  Laterality: N/A;  . CESAREAN SECTION    . CESAREAN SECTION WITH BILATERAL TUBAL LIGATION Bilateral 12/08/2015   Procedure: CESAREAN SECTION WITH BILATERAL TUBAL LIGATION;  Surgeon: Will Bonnet, MD;  Location: [facility] ORS;  Service: Obstetrics;  Laterality: Bilateral;  . DILATION AND CURETTAGE OF UTERUS    . ROBOTIC ASSISTED LAPAROSCOPIC HYSTERECTOMY AND SALPINGECTOMY  2017    Past Gynecologic History:  Menarche: 12 Menstrual details: n/a Menses regular: n/a Last Menstrual Period: n/a s/p hysterectomy History of Abnormal pap: See HPI Contraception: bilateral tubal ligation Sexually active: yes  OB History:  OB History  Gravida Para Term Preterm AB Living  7 4 4   3 4   SAB TAB Ectopic Multiple Live Births  3     0 4    # Outcome Date GA Lbr Len/2nd Weight Sex Delivery Anes PTL Lv  7 Term 12/08/15 [redacted]w[redacted]d  9 lb 3.8 oz (4.19 kg) M CS-Vac Spinal  LIV  6 SAB 09/2014          5 Term 01/28/13     CS-LTranv   LIV  4 SAB 07/2011          3 Term 12/15/10     CS-LTranv   LIV  2 SAB 04/2008          1 Term 07/13/98     Vag-Spont   LIV    Family History: Family History  Problem Relation Age of Onset  . Diabetes Mellitus II Father   . Lung cancer Maternal Grandmother   . Ovarian cancer Paternal Grandmother   . Cervical cancer Mother     Social History: Social History   Socioeconomic History  . Marital status: Married    Spouse name: Not on file  . Number of children: Not on file  . Years of education: Not on file  . Highest education level: Not on file  Occupational History  . Not on file  Social Needs  . Financial resource strain: Not on file  . Food insecurity    Worry: Not on file    Inability: Not on file  . Transportation needs    Medical: Not on file    Non-medical: Not on file  Tobacco Use  . Smoking status: Never Smoker  .  Smokeless tobacco: Never Used  Substance and Sexual Activity  . Alcohol use: No  . Drug use: No  . Sexual activity: Yes    Birth control/protection: Surgical  Lifestyle  . Physical activity    Days per week: Not on file    Minutes per session: Not on file  . Stress: Not on file  Relationships  . Social Herbalist on phone: Not on file    Gets together: Not on file    Attends religious service: Not on file    Active member of club or organization: Not on file    Attends meetings of clubs or organizations: Not on file    Relationship status: Not on file  . Intimate partner violence    Fear of current or ex partner: Not on file    Emotionally abused: Not on file    Physically abused: Not on file    Forced sexual activity: Not on file  Other Topics Concern  . Not on file  Social History Narrative  . Not on file    Allergies: Allergies  Allergen Reactions  . Tape Other (See Comments)    Burns skin    Current Medications: Current Outpatient Medications  Medication Sig Dispense Refill  . acetaminophen (TYLENOL) 325 MG tablet Take by mouth.    . escitalopram (LEXAPRO) 10 MG tablet Take 1 tablet (10 mg total) by mouth daily. 30 tablet 3  . hydrOXYzine (ATARAX/VISTARIL) 10 MG tablet Take 1 tablet (10 mg total) by mouth 3 (three) times daily as needed. 90 tablet 1   No current facility-administered medications for this visit.     Review of Systems General:  no complaints Skin: no complaints Eyes: no complaints HEENT: no complaints Breasts: no complaints Pulmonary: no complaints Cardiac: no complaints Gastrointestinal: no complaints Genitourinary/Sexual: no complaints Ob/Gyn: no complaints Musculoskeletal: no complaints Hematology: no complaints Neurologic/Psych: no complaints    Objective:  Physical Examination:  Today's Vitals   05/01/19 1144  BP: 122/90  Pulse: 75  Resp: 15  Temp: 98.6 F (37 C)  SpO2: 98%  Weight: 191 lb 6.4 oz (86.8 kg)   PainSc: 0-No pain   Body mass index is 35.01 kg/m.   ECOG Performance Status: 0 - Asymptomatic  General appearance: alert, cooperative and appears stated age 4: PERRLA, neck supple  Lymph node survey: non-palpable, axillary, inguinal, supraclavicular Cardiovascular: regular rate and rhythm Respiratory: lungs clear to auscultation. Abdomen: soft, non-tender, without masses or organomegaly, no hernias and well healed incisions. Incision all well healed. No hernias, no masses, no ascites.  Extremities:No edema Skin exam - Well healed incisions.  Neurological exam reveals alert, oriented, normal speech, no focal findings or movement disorder noted  Pelvic: Vulva: normal appearing vulva with no masses, tenderness or lesions; Vagina: normal; Bimanual: negative for masses or nodularity; Uterus and Cervix: surgically absent; Rectal: deferred .  Pelvic: exam chaperoned by NP;  Vulva: normal appearing vulva with no masses, tenderness or lesions; Vagina: normal vagina; Uterus: surgically absent, vaginal cuff well healed; Cervix: absent; BME Adnexa: not enlarged and vaginal nodularity seemed more irregular and located midline to right fornix; unable to provide exact measurement but larger than on prior exams. No visual abnormalities on rectal exam. Rectal: deferred.    Lab Review: n/a  Radiologic Imaging: n/a    Assessment:  Madison [name] is a 38 y.o. female diagnosed with probably stage IB1cervical cancer, adenocarcinoma, grade 2, s/p radical hysterectomy, bilateral SLN negative margins, negative parameteria and negative LVSI.    Vaginal nodularity of uncertain significance, and increasing size.   Medical co-morbidities complicating care: obesity and prior abdominal surgery. Body mass index is 35.01 kg/m.  Plan:   Problem List Items Addressed This Visit      Other   Cervical cancer, FIGO stage IB1 ([place]) - Primary   Relevant Orders   NM PET Image Restag (PS) Skull Base To  Thigh      Obtain PET scan. If negative continue alternating exams with Dr. Glennon Mac on a every 6 month schedule given vaginal nodule.   Survivorship plan previously discussed at her postoperative visit as noted below. If NED I have recommended continued close follow up with exams, including pelvic exams every 3-6 months for 2 years, then every 6-12 months for 3-5 years and then annually thereafter. Cervical/vaginal cytology may be considered annually, but is thought to have limited value for detecting recurrent cervical cancer. Imaging and laboratory assessment is based on clinical indication. Patient education for obesity, lifestyle, exercise, nutrition,sexual health,  vaginal dilator use, vaginal lubricants/moisturizers was provided.   The patient's diagnosis, an outline of the further diagnostic and laboratory studies which will be required, the recommendation, and alternatives were discussed.  All questions were answered to the patient's satisfaction.  A total of 25 minutes were spent with the patient/family today; >50% was spent in education, counseling and coordination of care for cervical cancer.    Beckey Rutter, NP  I personally had a face to face interaction and evaluated the patient jointly with the NP, Ms. Beckey Rutter.  I have reviewed her history and available records and have performed the key portions of the physical exam including general, lymphatic survey HEENT, abdominal exam, and pelvic exam with my findings confirming those documented above by the APP.  I have discussed the case with the APP and the patient.  I agree with the above documentation, assessment and plan which was fully formulated by me.  Counseling was completed by me.   I personally saw the patient and performed a substantive portion of this encounter in conjunction with the listed APP as documented above.  Angeles Gaetana Michaelis, MD    CC:  Dr. Prentice Docker

## 2019-05-07 ENCOUNTER — Encounter
Admission: RE | Admit: 2019-05-07 | Discharge: 2019-05-07 | Disposition: A | Discharge status: Home / Self Care | Payer: BC Managed Care – PPO | Source: Ambulatory Visit | Attending: Nurse Practitioner | Admitting: Nurse Practitioner

## 2019-05-07 ENCOUNTER — Other Ambulatory Visit: Payer: Self-pay

## 2019-05-07 DIAGNOSIS — C539 Malignant neoplasm of cervix uteri, unspecified: Secondary | ICD-10-CM | POA: Insufficient documentation

## 2019-05-07 LAB — GLUCOSE, CAPILLARY: Glucose-Capillary: 95 mg/dL (ref 70–99)

## 2019-05-07 MED ORDER — FLUDEOXYGLUCOSE F - 18 (FDG) INJECTION
10.3100 | Freq: Once | INTRAVENOUS | Status: AC | PRN
  Administered 2019-05-07: 09:00:00 10.31 via INTRAVENOUS

## 2019-05-13 ENCOUNTER — Other Ambulatory Visit: Payer: Self-pay

## 2019-05-13 ENCOUNTER — Encounter: Payer: Self-pay | Admitting: Family Medicine

## 2019-05-13 ENCOUNTER — Ambulatory Visit (INDEPENDENT_AMBULATORY_CARE_PROVIDER_SITE_OTHER): Payer: BC Managed Care – PPO | Admitting: Family Medicine

## 2019-05-13 DIAGNOSIS — F411 Generalized anxiety disorder: Secondary | ICD-10-CM

## 2019-05-13 MED ORDER — HYDROXYZINE HCL 10 MG PO TABS: 10.0000 mg | ORAL_TABLET | Freq: Three times a day (TID) | ORAL | 1 refills | Status: DC | PRN

## 2019-05-13 MED ORDER — ESCITALOPRAM OXALATE 10 MG PO TABS: 10.0000 mg | ORAL_TABLET | Freq: Every day | ORAL | 1 refills | Status: DC

## 2019-05-13 NOTE — Progress Notes (Signed)
Temp (!) 96.9 F (36.1 C) (Oral)   LMP 05/07/2016 (Exact Date)    Subjective:    Patient ID: Madison [name], female    DOB: [birth year], 38 y.o.   MRN: [id number]  HPI: Madison [name] is a 38 y.o. female  Chief Complaint  Patient presents with  . Anxiety   ANXIETY/STRESS Duration:better Anxious mood: no  Excessive worrying: no Irritability: no  Sweating: no Nausea: no Palpitations:no Hyperventilation: no Panic attacks: no Agoraphobia: no  Obscessions/compulsions: no Depressed mood: no Depression screen [facility] 2/9 04/08/2019 03/04/2019 07/10/2018 09/16/2016 08/17/2016  Decreased Interest 0 0 0 0 1  Down, Depressed, Hopeless 0 0 0 0 1  PHQ - 2 Score 0 0 0 0 2  Altered sleeping 0 0 0 0 -  Tired, decreased energy 0 1 0 0 -  Change in appetite 0 3 0 0 -  Feeling bad or failure about yourself  0 0 0 0 -  Trouble concentrating 0 0 0 0 -  Moving slowly or fidgety/restless 0 0 0 0 -  Suicidal thoughts 0 0 0 0 -  PHQ-9 Score 0 4 0 0 -  Difficult doing work/chores Not difficult at all Somewhat difficult Not difficult at all - -   GAD 7 : Generalized Anxiety Score 05/13/2019 04/08/2019 03/04/2019 08/17/2016  Nervous, Anxious, on Edge 0 0 3 1  Control/stop worrying 0 0 3 1  Worry too much - different things 0 0 3 1  Trouble relaxing 0 0 2 1  Restless 0 0 0 0  Easily annoyed or irritable 0 0 2 1  Afraid - awful might happen 0 0 1 1  Total GAD 7 Score 0 0 14 6  Anxiety Difficulty - Not difficult at all Somewhat difficult Somewhat difficult   Anhedonia: no Weight changes: no Insomnia: no   Hypersomnia: no Fatigue/loss of energy: no Feelings of worthlessness: no Feelings of guilt: no Impaired concentration/indecisiveness: no Suicidal ideations: no  Crying spells: no Recent Stressors/Life Changes: yes   Relationship problems: no   Family stress: no     Financial stress: no    Job stress: no    Recent death/loss: no  Relevant past medical, surgical, family and social  history reviewed and updated as indicated. Interim medical history since our last visit reviewed. Allergies and medications reviewed and updated.  Review of Systems  Constitutional: Negative.   Respiratory: Negative.   Cardiovascular: Negative.   Neurological: Negative.   Psychiatric/Behavioral: Negative.     Per HPI unless specifically indicated above     Objective:    Temp (!) 96.9 F (36.1 C) (Oral)   LMP 05/07/2016 (Exact Date)   Wt Readings from Last 3 Encounters:  05/01/19 191 lb 6.4 oz (86.8 kg)  03/04/19 194 lb (88 kg)  01/22/19 195 lb (88.5 kg)    Physical Exam Vitals signs and nursing note reviewed.  Constitutional:      General: She is not in acute distress.    Appearance: Normal appearance. She is not ill-appearing, toxic-appearing or diaphoretic.  HENT:     Head: Normocephalic and atraumatic.     Right Ear: External ear normal.     Left Ear: External ear normal.     Nose: Nose normal.     Mouth/Throat:     Mouth: Mucous membranes are moist.     Pharynx: Oropharynx is clear.  Eyes:     General: No scleral icterus.       Right eye: No  discharge.        Left eye: No discharge.     Conjunctiva/sclera: Conjunctivae normal.     Pupils: Pupils are equal, round, and reactive to light.  Neck:     Musculoskeletal: Normal range of motion.  Pulmonary:     Effort: Pulmonary effort is normal. No respiratory distress.     Comments: Speaking in full sentences Musculoskeletal: Normal range of motion.  Skin:    Coloration: Skin is not jaundiced or pale.     Findings: No bruising, erythema, lesion or rash.  Neurological:     Mental Status: She is alert and oriented to person, place, and time. Mental status is at baseline.  Psychiatric:        Mood and Affect: Mood normal.        Behavior: Behavior normal.        Thought Content: Thought content normal.        Judgment: Judgment normal.     Results for orders placed or performed during the hospital encounter of  05/07/19  Glucose, capillary  Result Value Ref Range   Glucose-Capillary 95 70 - 99 mg/dL      Assessment & Plan:   Problem List Items Addressed This Visit      Other   Anxiety disorder    Doing much better! Feeling like herself. Will refill her medicines. Continue to monitor. Call with any concerns. Recheck 6 months.       Relevant Medications   escitalopram (LEXAPRO) 10 MG tablet   hydrOXYzine (ATARAX/VISTARIL) 10 MG tablet       Follow up plan: Return in about 6 months (around 11/10/2019).   . This visit was completed via [employer] due to the restrictions of the COVID-19 pandemic. All issues as above were discussed and addressed. Physical exam was done as above through visual confirmation on [employer]ity. If it was felt that the patient should be evaluated in the office, they were directed there. The patient verbally consented to this visit. . Location of the patient: home . Location of the provider: work . Those involved with this call:  . Provider: Park Liter, DO . CMA: Tiffany Monsivais, CMA . Front Desk/Registration: Don Perking  . Time spent on call: 15 minutes with patient face to face via video conference. More than 50% of this time was spent in counseling and coordination of care. 23 minutes total spent in review of patient's record and preparation of their chart.

## 2019-05-13 NOTE — Assessment & Plan Note (Signed)
Doing much better! Feeling like herself. Will refill her medicines. Continue to monitor. Call with any concerns. Recheck 6 months.

## 2019-05-17 ENCOUNTER — Telehealth: Payer: Self-pay

## 2019-05-17 DIAGNOSIS — C539 Malignant neoplasm of cervix uteri, unspecified: Secondary | ICD-10-CM

## 2019-05-17 NOTE — Telephone Encounter (Signed)
Contacted Madison [name] and reviewed PET results. Dr. Theora Gianotti would like a pelvic MRI to look at the right ovary more closely. Orders placed. Madison [name] has no further questions at this time.  IMPRESSION: Interval hysterectomy. No abnormal FDG uptake seen involving the vaginal cuff.  New focus of hypermetabolic activity in the right pelvis, which appears to be within the right ovary rather than a lymph node. This may merely represent a corpus luteum cyst in a reproductive age female, rather than malignancy. Pelvic MRI without and with contrast is recommended for further evaluation.  No other sites of malignancy identified.

## 2019-05-20 ENCOUNTER — Telehealth: Payer: Self-pay | Admitting: *Deleted

## 2019-05-20 NOTE — Telephone Encounter (Signed)
Per Steffanie Dunn 05/17/19 scheduling message to schedule PELVIS MRI  MRI has been scheduled as requested.. I called pt and left a detailed message making her aware of the scheduled appt. Location, date and time. And to contact our office with any questions about her appt if any.

## 2019-05-30 ENCOUNTER — Other Ambulatory Visit: Payer: Self-pay

## 2019-05-30 ENCOUNTER — Ambulatory Visit
Admission: RE | Admit: 2019-05-30 | Discharge: 2019-05-30 | Disposition: A | Discharge status: Home / Self Care | Payer: BC Managed Care – PPO | Source: Ambulatory Visit | Attending: Nurse Practitioner | Admitting: Nurse Practitioner

## 2019-05-30 DIAGNOSIS — C539 Malignant neoplasm of cervix uteri, unspecified: Secondary | ICD-10-CM | POA: Insufficient documentation

## 2019-05-30 MED ORDER — GADOBUTROL 1 MMOL/ML IV SOLN
8.0000 mL | Freq: Once | INTRAVENOUS | Status: AC | PRN
  Administered 2019-05-30: 09:00:00 8 mL via INTRAVENOUS

## 2019-05-31 ENCOUNTER — Telehealth: Payer: Self-pay

## 2019-05-31 NOTE — Telephone Encounter (Signed)
Madison [name] returned call. Discussed results of MRI below.  IMPRESSION: No evidence of pelvic mass or lymphadenopathy.  1.5 cm follicular cyst in right ovary, which corresponds to site of FDG uptake on recent PET-CT and is considered physiologic in a reproductive age female.

## 2019-05-31 NOTE — Telephone Encounter (Signed)
Voicemail left with [facility] to return call for MRI results. They should also release to her my chart in the next few days.  IMPRESSION: No evidence of pelvic mass or lymphadenopathy.  1.5 cm follicular cyst in right ovary, which corresponds to site of FDG uptake on recent PET-CT and is considered physiologic in a reproductive age female.

## 2019-07-11 ENCOUNTER — Other Ambulatory Visit: Payer: Self-pay

## 2019-07-11 NOTE — Telephone Encounter (Signed)
error 

## 2019-10-30 ENCOUNTER — Inpatient Hospital Stay: Payer: Self-pay

## 2019-11-06 ENCOUNTER — Inpatient Hospital Stay: Payer: BC Managed Care – PPO | Attending: Obstetrics and Gynecology | Admitting: Obstetrics and Gynecology

## 2019-11-06 VITALS — BP 119/82 | HR 65 | Resp 20 | Wt 185.0 lb

## 2019-11-06 DIAGNOSIS — Z9079 Acquired absence of other genital organ(s): Secondary | ICD-10-CM | POA: Diagnosis not present

## 2019-11-06 DIAGNOSIS — F419 Anxiety disorder, unspecified: Secondary | ICD-10-CM | POA: Insufficient documentation

## 2019-11-06 DIAGNOSIS — C539 Malignant neoplasm of cervix uteri, unspecified: Secondary | ICD-10-CM

## 2019-11-06 DIAGNOSIS — Z8541 Personal history of malignant neoplasm of cervix uteri: Secondary | ICD-10-CM | POA: Insufficient documentation

## 2019-11-06 DIAGNOSIS — Z6833 Body mass index (BMI) 33.0-33.9, adult: Secondary | ICD-10-CM | POA: Diagnosis not present

## 2019-11-06 DIAGNOSIS — F329 Major depressive disorder, single episode, unspecified: Secondary | ICD-10-CM | POA: Insufficient documentation

## 2019-11-06 DIAGNOSIS — Z79899 Other long term (current) drug therapy: Secondary | ICD-10-CM | POA: Diagnosis not present

## 2019-11-06 DIAGNOSIS — E669 Obesity, unspecified: Secondary | ICD-10-CM | POA: Diagnosis not present

## 2019-11-06 DIAGNOSIS — Z801 Family history of malignant neoplasm of trachea, bronchus and lung: Secondary | ICD-10-CM | POA: Diagnosis not present

## 2019-11-06 DIAGNOSIS — Z833 Family history of diabetes mellitus: Secondary | ICD-10-CM | POA: Insufficient documentation

## 2019-11-06 DIAGNOSIS — Z8049 Family history of malignant neoplasm of other genital organs: Secondary | ICD-10-CM | POA: Diagnosis not present

## 2019-11-06 DIAGNOSIS — Z8041 Family history of malignant neoplasm of ovary: Secondary | ICD-10-CM | POA: Insufficient documentation

## 2019-11-06 DIAGNOSIS — Z90710 Acquired absence of both cervix and uterus: Secondary | ICD-10-CM | POA: Insufficient documentation

## 2019-11-06 NOTE — Progress Notes (Signed)
Patient denies any concerns today.  

## 2019-11-06 NOTE — Progress Notes (Signed)
Gynecologic Oncology Visit   Referring Provider: Dr. Prentice Docker  Chief Concern: stage IB1 cervical adenocarcinoma, surveillance visit  Subjective:  Madison [name] is a 39 y.o. [id number] female, initially seen in consultation from Dr. Prentice Docker returns to clinic today for continued surveillance of stage IB1 cervical adenocarcinoma s/p  robotic-assisted radical hysterectomy, bilateral salpingectomy, ureterolysis, bilateral sentinel lymph node mapping and biopsy, repair of serotomy on 06/09/2016.   She last saw Dr. Theora Gianotti in 04/2019. Based on vaginal nodularity, PET was ordered which showed no abnormal FDG uptake in vaginal cuff, new focus of hypermetabolic activity in right pelvis within right ovary. Pelvic MRI was ordered for further evaluation which was consistent with follicular cyst and considered physiologic.   Last pap was 07/10/2018- NILM, HPV negative.   Today, she feels well and denies specific complaints. 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 Her husband got a promotion and they will be moving back to [place].    Gynecologic Oncology History:  Madison [name] is a pleasant [id number] female who is seen in consultation from Dr. Prentice Docker for evaluation of invasive adenocarcinoma of uterine cervix.   04/28/2016 CKC on revealed stage IB1 adenocarcinoma of the cervix.  DIAGNOSIS:  A. CERVIX; COLD KNIFE CONIZATION:  - INVASIVE ENDOCERVICAL ADENOCARCINOMA, USUAL TYPE.  - Moderately differentiated - LYMPH VASCULAR INVASION IS PRESENT.  - THE DEEP AND ECTOCERVICAL MARGINS ARE POSITIVE.   05/16/2016 PET/CT negative for metastatic disease.   She underwent robotic-assisted radical hysterectomy, bilateral salpingectomy, ureterolysis, bilateral sentinel lymph node mapping and biopsy, repair of serotomy on 06/09/2016.   Pathology: Invasive endocervical adenocarcinoma, involving 3:00 to 6:00 and 6:00 to 9:00 quadrants. Tumor invades 5/13 mm.  Mucinous Adenocarcinoma  Subtype:Endocervical  Histologic Grade:G2: Moderately differentiated  Tumor Size:Greatest dimension (cm): 1.2 cm  Stromal Invasion:  Depth (mm):8 mm  Horizontal Extent (mm):9 mm  Lymph-Vascular Invasion:Not identified  MARGINS  Margins:Uninvolved by invasive carcinoma  Distance of Invasive Carcinoma from Closest Margin:Specify (mm): 7 mm  Bilateral SLNs negative (0/3)   Paracervical endometriosis.  Bilateral fallopian tubes: Negative for malignancy. Six lymph parametrial nodes negative for metastatic carcinoma. (0/6)  No further therapy recommended.   Reviewed findings from [facility] trial demonstrating worse survival outcomes with MIS radical hysterectomy compared to open procedures. The outcomes for tumors <2 cm, which is her situation, was reassuring. I recommended continued close surveillance.  09/13/17- Pap - NILM, HPV negative.   She has been NED. Reports normal intercourse. She has moved to [place] but wishes to continue to keep her care here in [place].    She was last seen on 07/10/2018 with Dr. Glennon Mac. Pap NILM. Exam by Dr. Glennon Mac 3 mm vaginal nodule noted right of midline. Previously palpated as 9-10 mm in our clinic so overall reassuring findings.   She saw Dr. Glennon Mac on 01/22/2019. Per his note, vaginal cuff intact. An approximately ~3 mm nodule noted just right of midline, especially on palpation.  If there is a larger nodularity (there is a discrepancy between my exam and gyn onc who sees ~62mm), it could be the way the vagina has healed and the way cuff appears after closure, as I do not feel firmness in this area, but do appreciate an area that could be construed as a nodule to inspection (and it may be). This who area appears unchanged to me from November 2019.  Rectovaginal deferred    Problem List: Patient Active Problem List   Diagnosis Date Noted  . Family history of ovarian  cancer 07/10/2018  . Cervical  cancer, FIGO stage IB1 ([place]) 07/20/2016  . Anxiety disorder 07/11/2016  . Malignant neoplasm of cervix ([place]) 05/11/2016    Past Medical History: Past Medical History:  Diagnosis Date  . Anxiety   . Cancer [facility]) 2017   Cervical  . Depression     Past Surgical History: Past Surgical History:  Procedure Laterality Date  . BREAST ENHANCEMENT SURGERY Bilateral   . BREAST SURGERY    . CERVICAL CONIZATION W/BX N/A 04/28/2016   Procedure: CONIZATION CERVIX WITH BIOPSY;  Surgeon: Will Bonnet, MD;  Location: [facility] ORS;  Service: Gynecology;  Laterality: N/A;  . CESAREAN SECTION    . CESAREAN SECTION WITH BILATERAL TUBAL LIGATION Bilateral 12/08/2015   Procedure: CESAREAN SECTION WITH BILATERAL TUBAL LIGATION;  Surgeon: Will Bonnet, MD;  Location: [facility] ORS;  Service: Obstetrics;  Laterality: Bilateral;  . DILATION AND CURETTAGE OF UTERUS    . ROBOTIC ASSISTED LAPAROSCOPIC HYSTERECTOMY AND SALPINGECTOMY  2017    Past Gynecologic History:  Menarche: 12 Menstrual details: n/a Menses regular: n/a Last Menstrual Period: n/a s/p hysterectomy History of Abnormal pap: See HPI Contraception: bilateral tubal ligation Sexually active: yes  OB History:  OB History  Gravida Para Term Preterm AB Living  7 4 4   3 4   SAB TAB Ectopic Multiple Live Births  3     0 4    # Outcome Date GA Lbr Len/2nd Weight Sex Delivery Anes PTL Lv  7 Term 12/08/15 [redacted]w[redacted]d  9 lb 3.8 oz (4.19 kg) M CS-Vac Spinal  LIV  6 SAB 09/2014          5 Term 01/28/13     CS-LTranv   LIV  4 SAB 07/2011          3 Term 12/15/10     CS-LTranv   LIV  2 SAB 04/2008          1 Term 07/13/98     Vag-Spont   LIV    Family History: Family History  Problem Relation Age of Onset  . Diabetes Mellitus II Father   . Lung cancer Maternal Grandmother   . Ovarian cancer Paternal Grandmother   . Cervical cancer Mother     Social History: Social History   Socioeconomic History  . Marital status: Married    Spouse name:  Not on file  . Number of children: Not on file  . Years of education: Not on file  . Highest education level: Not on file  Occupational History  . Not on file  Tobacco Use  . Smoking status: Never Smoker  . Smokeless tobacco: Never Used  Substance and Sexual Activity  . Alcohol use: No  . Drug use: No  . Sexual activity: Yes    Birth control/protection: Surgical  Other Topics Concern  . Not on file  Social History Narrative  . Not on file   Social Determinants of Health   Financial Resource Strain:   . Difficulty of Paying Living Expenses:   Food Insecurity:   . Worried About Charity fundraiser in the Last Year:   . Arboriculturist in the Last Year:   Transportation Needs:   . Film/video editor (Medical):   Marland Kitchen Lack of Transportation (Non-Medical):   Physical Activity:   . Days of Exercise per Week:   . Minutes of Exercise per Session:   Stress:   . Feeling of Stress :   Social Connections:   .  Frequency of Communication with Friends and Family:   . Frequency of Social Gatherings with Friends and Family:   . Attends Religious Services:   . Active Member of Clubs or Organizations:   . Attends Archivist Meetings:   Marland Kitchen Marital Status:   Intimate Partner Violence:   . Fear of Current or Ex-Partner:   . Emotionally Abused:   Marland Kitchen Physically Abused:   . Sexually Abused:     Allergies: Allergies  Allergen Reactions  . Tape Other (See Comments)    Burns skin    Current Medications: Current Outpatient Medications  Medication Sig Dispense Refill  . acetaminophen (TYLENOL) 325 MG tablet Take by mouth.    . escitalopram (LEXAPRO) 10 MG tablet Take 1 tablet (10 mg total) by mouth daily. 90 tablet 1  . hydrOXYzine (ATARAX/VISTARIL) 10 MG tablet Take 1 tablet (10 mg total) by mouth 3 (three) times daily as needed. 270 tablet 1   No current facility-administered medications for this visit.    Review of Systems General:  no complaints Skin: no  complaints Eyes: no complaints HEENT: no complaints Breasts: no complaints Pulmonary: no complaints Cardiac: no complaints Gastrointestinal: no complaints Genitourinary/Sexual: no complaints Ob/Gyn: no complaints Musculoskeletal: no complaints Hematology: no complaints Neurologic/Psych: no complaints  Objective:  Physical Examination:  Today's Vitals   11/06/19 1541  BP: 119/82  Pulse: 65  Resp: 20  Weight: 185 lb (83.9 kg)  PainSc: 0-No pain   Body mass index is 33.84 kg/m.   ECOG Performance Status: 0 - Asymptomatic  GENERAL: Patient is a well appearing female in no acute distress HEENT:  Sclera clear. Anicteric NODES:  Negative axillary, supraclavicular, inguinal lymph node survery LUNGS:  Clear to auscultation bilaterally.   HEART:  Regular rate and rhythm.  ABDOMEN:  Soft, nontender.  No hernias, incisions well healed. No masses or ascites EXTREMITIES:  No peripheral edema. Atraumatic. No cyanosis SKIN:  Clear with no obvious rashes or skin changes.  NEURO:  Nonfocal. Well oriented.  Appropriate affect.  Pelvic: exam chaperoned by NP;  Vulva: normal appearing vulva with no masses, tenderness or lesions; Vagina: normal vagina; Uterus: surgically absent, vaginal cuff well healed; Cervix: absent; BME Adnexa: not enlarged and vaginal nodularity located midline to right fornix and is stable; no larger than ~1cm. No visual abnormalities on rectal exam. Rectal: deferred.   Lab Review: n/a  Radiologic Imaging: n/a    Assessment:  Madison [name] is a 39 y.o. female diagnosed with probably stage IB1cervical cancer, adenocarcinoma, grade 2, s/p radical hysterectomy, bilateral SLN negative margins, negative parameteria and negative LVSI.    Vaginal nodularity of uncertain significance, stable   Medical co-morbidities complicating care: obesity and prior abdominal surgery. Body mass index is 33.84 kg/m.  Plan:   Problem List Items Addressed This Visit      Other    Cervical cancer, FIGO stage IB1 ([place]) - Primary   Relevant Orders   IGP, Aptima HPV     She will check with her PCP, Dr. Albertine Grates, if they will perform her pelvic exams. If not we will recommend she re-establish care with Dr. Prentice Docker. She and her family will be moving back to this area.   Survivorship plan previously discussed at her postoperative visit as noted below. If NED I have recommended continued close follow up with exams, including pelvic exams every 3-6 months for 2 years, then every 6-12 months for 3-5 years and then annually thereafter. Cervical/vaginal cytology may be considered  annually, but is thought to have limited value for detecting recurrent cervical cancer. Imaging and laboratory assessment is based on clinical indication. Patient education for obesity, lifestyle, exercise, nutrition,sexual health, vaginal dilator use, vaginal lubricants/moisturizers was provided.   We discussed the Pap smears and since she has not had radiation we will repeat Pap today.   The patient's diagnosis, an outline of the further diagnostic and laboratory studies which will be required, the recommendation, and alternatives were discussed.  All questions were answered to the patient's satisfaction.  A total of 25 minutes were spent with the patient/family today; >50% was spent in education, counseling and coordination of care for cervical cancer.    Beckey Rutter, NP  I personally had a face to face interaction and evaluated the patient jointly with the NP, Ms. Beckey Rutter.  I have reviewed her history and available records and have performed the key portions of the physical exam including general, lymphatic survey HEENT, abdominal exam, and pelvic exam with my findings confirming those documented above by the APP.  I have discussed the case with the APP and the patient.  I agree with the above documentation, assessment and plan which was fully formulated by me.  Counseling was  completed by me.   I personally saw the patient and performed a substantive portion of this encounter in conjunction with the listed APP as documented above.  Angeles Gaetana Michaelis, MD    CC:  Dr. Prentice Docker

## 2019-11-12 LAB — IGP, APTIMA HPV: HPV Aptima: NEGATIVE

## 2020-01-27 ENCOUNTER — Ambulatory Visit: Payer: BC Managed Care – PPO | Admitting: Family Medicine

## 2020-05-04 ENCOUNTER — Telehealth: Payer: Self-pay | Admitting: Nurse Practitioner

## 2020-05-04 NOTE — Telephone Encounter (Signed)
Writer was asked to phone patient and move patient's 05-13-20 appt to 05-12-20. Writer spoke with patient and moved appt to 05-12-20.

## 2020-05-08 ENCOUNTER — Encounter: Payer: Self-pay | Admitting: Family Medicine

## 2020-05-12 ENCOUNTER — Inpatient Hospital Stay: Payer: Self-pay | Attending: Nurse Practitioner | Admitting: Nurse Practitioner

## 2020-05-12 ENCOUNTER — Other Ambulatory Visit: Payer: Self-pay

## 2020-05-12 ENCOUNTER — Encounter: Payer: Self-pay | Admitting: Nurse Practitioner

## 2020-05-12 VITALS — BP 128/92 | HR 89 | Temp 97.8°F | Resp 20 | Wt 207.6 lb

## 2020-05-12 DIAGNOSIS — C539 Malignant neoplasm of cervix uteri, unspecified: Secondary | ICD-10-CM

## 2020-05-12 DIAGNOSIS — Z90710 Acquired absence of both cervix and uterus: Secondary | ICD-10-CM | POA: Insufficient documentation

## 2020-05-12 DIAGNOSIS — Z801 Family history of malignant neoplasm of trachea, bronchus and lung: Secondary | ICD-10-CM | POA: Insufficient documentation

## 2020-05-12 DIAGNOSIS — Z8041 Family history of malignant neoplasm of ovary: Secondary | ICD-10-CM | POA: Insufficient documentation

## 2020-05-12 DIAGNOSIS — Z90722 Acquired absence of ovaries, bilateral: Secondary | ICD-10-CM | POA: Insufficient documentation

## 2020-05-12 NOTE — Progress Notes (Signed)
Gynecologic Oncology interval visit   Referring Provider: Dr. Prentice Docker  Chief Concern: stage IB1 cervical adenocarcinoma, surveillance visit  Subjective:  Madison [name] is a 39 y.o. [id number] female, initially seen in consultation from Dr. Prentice Docker returns to clinic today for continued surveillance of stage IB1 cervical adenocarcinoma s/p robotic-assisted radical hysterectomy, bilateral salpingectomy, ureterolysis, bilateral sentinel lymph node mapping and biopsy, repair of serotomy on 06/09/2016.   Last seem in March by Dr. Theora Gianotti and myself with no evidence of disease. In the interim, she denies bleeding, discharge, pain, changes in bowel movements. She has moved back to  and her children are re-enrolled in their old school. Her husband has started his own business.     Gynecologic Oncology History:  Madison [name] is a pleasant [id number] female who is seen in consultation from Dr. Prentice Docker for evaluation of invasive adenocarcinoma of uterine cervix.   04/28/2016 CKC on revealed stage IB1 adenocarcinoma of the cervix.  DIAGNOSIS:  A. CERVIX; COLD KNIFE CONIZATION:  - INVASIVE ENDOCERVICAL ADENOCARCINOMA, USUAL TYPE.  - Moderately differentiated - LYMPH VASCULAR INVASION IS PRESENT.  - THE DEEP AND ECTOCERVICAL MARGINS ARE POSITIVE.   05/16/2016 PET/CT negative for metastatic disease.   She underwent robotic-assisted radical hysterectomy, bilateral salpingectomy, ureterolysis, bilateral sentinel lymph node mapping and biopsy, repair of serotomy on 06/09/2016.   Pathology: Invasive endocervical adenocarcinoma, involving 3:00 to 6:00 and 6:00 to 9:00 quadrants. Tumor invades 5/13 mm.  Mucinous Adenocarcinoma Subtype:Endocervical  Histologic Grade:G2: Moderately differentiated  Tumor Size:Greatest dimension (cm): 1.2 cm  Stromal Invasion:  Depth (mm):8 mm  Horizontal Extent (mm):9 mm  Lymph-Vascular Invasion:Not  identified  MARGINS  Margins:Uninvolved by invasive carcinoma  Distance of Invasive Carcinoma from Closest Margin:Specify (mm): 7 mm  Bilateral SLNs negative (0/3)   Paracervical endometriosis.  Bilateral fallopian tubes: Negative for malignancy. Six lymph parametrial nodes negative for metastatic carcinoma. (0/6)  No further therapy recommended.   Reviewed findings from [facility] trial demonstrating worse survival outcomes with MIS radical hysterectomy compared to open procedures. The outcomes for tumors <2 cm, which is her situation, was reassuring. I recommended continued close surveillance.  09/13/17- Pap - NILM, HPV negative.   She has been NED. Reports normal intercourse. She has moved to [place] but wishes to continue to keep her care here in [place].    She was last seen on 07/10/2018 with Dr. Glennon Mac. Pap NILM. Exam by Dr. Glennon Mac 3 mm vaginal nodule noted right of midline. Previously palpated as 9-10 mm in our clinic so overall reassuring findings.   She saw Dr. Glennon Mac on 01/22/2019. Per his note, vaginal cuff intact. An approximately ~3 mm nodule noted just right of midline, especially on palpation.  If there is a larger nodularity (there is a discrepancy between my exam and gyn onc who sees ~74mm), it could be the way the vagina has healed and the way cuff appears after closure, as I do not feel firmness in this area, but do appreciate an area that could be construed as a nodule to inspection (and it may be). This who area appears unchanged to me from November 2019.  Rectovaginal deferred   She saw Dr. Theora Gianotti in 04/2019. Based on vaginal nodularity, PET was ordered which showed no abnormal FDG uptake in vaginal cuff, new focus of hypermetabolic activity in right pelvis within right ovary. Pelvic MRI was ordered for further evaluation which was consistent with follicular cyst and considered physiologic.   Last pap was 07/10/2018- NILM, HPV negative.  Problem List: Patient  Active Problem List   Diagnosis Date Noted  . Family history of ovarian cancer 07/10/2018  . Cervical cancer, FIGO stage IB1 ([place]) 07/20/2016  . Anxiety disorder 07/11/2016  . Malignant neoplasm of cervix ([place]) 05/11/2016    Past Medical History: Past Medical History:  Diagnosis Date  . Anxiety   . Cancer [facility]) 2017   Cervical  . Depression     Past Surgical History: Past Surgical History:  Procedure Laterality Date  . BREAST ENHANCEMENT SURGERY Bilateral   . BREAST SURGERY    . CERVICAL CONIZATION W/BX N/A 04/28/2016   Procedure: CONIZATION CERVIX WITH BIOPSY;  Surgeon: Will Bonnet, MD;  Location: [facility] ORS;  Service: Gynecology;  Laterality: N/A;  . CESAREAN SECTION    . CESAREAN SECTION WITH BILATERAL TUBAL LIGATION Bilateral 12/08/2015   Procedure: CESAREAN SECTION WITH BILATERAL TUBAL LIGATION;  Surgeon: Will Bonnet, MD;  Location: [facility] ORS;  Service: Obstetrics;  Laterality: Bilateral;  . DILATION AND CURETTAGE OF UTERUS    . ROBOTIC ASSISTED LAPAROSCOPIC HYSTERECTOMY AND SALPINGECTOMY  2017    Past Gynecologic History:  Menarche: 12 Menstrual details: n/a Menses regular: n/a Last Menstrual Period: n/a s/p hysterectomy History of Abnormal pap: See HPI Contraception: bilateral tubal ligation Sexually active: yes  OB History:  OB History  Gravida Para Term Preterm AB Living  7 4 4   3 4   SAB TAB Ectopic Multiple Live Births  3     0 4    # Outcome Date GA Lbr Len/2nd Weight Sex Delivery Anes PTL Lv  7 Term 12/08/15 [redacted]w[redacted]d  9 lb 3.8 oz (4.19 kg) M CS-Vac Spinal  LIV  6 SAB 09/2014          5 Term 01/28/13     CS-LTranv   LIV  4 SAB 07/2011          3 Term 12/15/10     CS-LTranv   LIV  2 SAB 04/2008          1 Term 07/13/98     Vag-Spont   LIV    Family History: Family History  Problem Relation Age of Onset  . Diabetes Mellitus II Father   . Lung cancer Maternal Grandmother   . Ovarian cancer Paternal Grandmother   . Cervical cancer Mother      Social History: Social History   Socioeconomic History  . Marital status: Married    Spouse name: Not on file  . Number of children: Not on file  . Years of education: Not on file  . Highest education level: Not on file  Occupational History  . Not on file  Tobacco Use  . Smoking status: Never Smoker  . Smokeless tobacco: Never Used  Vaping Use  . Vaping Use: Never used  Substance and Sexual Activity  . Alcohol use: No  . Drug use: No  . Sexual activity: Yes    Birth control/protection: Surgical  Other Topics Concern  . Not on file  Social History Narrative  . Not on file   Social Determinants of Health   Financial Resource Strain:   . Difficulty of Paying Living Expenses: Not on file  Food Insecurity:   . Worried About Charity fundraiser in the Last Year: Not on file  . Ran Out of Food in the Last Year: Not on file  Transportation Needs:   . Lack of Transportation (Medical): Not on file  . Lack of Transportation (Non-Medical): Not on  file  Physical Activity:   . Days of Exercise per Week: Not on file  . Minutes of Exercise per Session: Not on file  Stress:   . Feeling of Stress : Not on file  Social Connections:   . Frequency of Communication with Friends and Family: Not on file  . Frequency of Social Gatherings with Friends and Family: Not on file  . Attends Religious Services: Not on file  . Active Member of Clubs or Organizations: Not on file  . Attends Archivist Meetings: Not on file  . Marital Status: Not on file  Intimate Partner Violence:   . Fear of Current or Ex-Partner: Not on file  . Emotionally Abused: Not on file  . Physically Abused: Not on file  . Sexually Abused: Not on file    Allergies: Allergies  Allergen Reactions  . Tape Other (See Comments)    Burns skin    Current Medications: Current Outpatient Medications  Medication Sig Dispense Refill  . acetaminophen (TYLENOL) 325 MG tablet Take by mouth.    .  escitalopram (LEXAPRO) 10 MG tablet Take 1 tablet (10 mg total) by mouth daily. 90 tablet 1  . hydrOXYzine (ATARAX/VISTARIL) 10 MG tablet Take 1 tablet (10 mg total) by mouth 3 (three) times daily as needed. 270 tablet 1   No current facility-administered medications for this visit.    Review of Systems General:  no complaints Skin: no complaints Eyes: no complaints HEENT: no complaints Breasts: no complaints Pulmonary: no complaints Cardiac: no complaints Gastrointestinal: no complaints Genitourinary/Sexual: no complaints Ob/Gyn: no complaints Musculoskeletal: no complaints Hematology: no complaints Neurologic/Psych: no complaints   Objective:  Physical Examination:  Today's Vitals   05/12/20 1510 05/12/20 1511  BP: (!) 128/92   Pulse: 89   Resp: 20   Temp: 97.8 F (36.6 C)   SpO2: 100%   Weight: 207 lb 9.6 oz (94.2 kg)   PainSc:  0-No pain   Body mass index is 37.97 kg/m.  ECOG Performance Status: 0 - Asymptomatic  GENERAL: Patient is a well appearing female in no acute distress HEENT:  Sclera clear. Anicteric NODES:  Negative axillary, supraclavicular, inguinal lymph node survery LUNGS:  Clear to auscultation bilaterally.   HEART:  Regular rate and rhythm.  ABDOMEN:  Soft, nontender.  No hernias, incisions well healed. No masses or ascites EXTREMITIES:  No peripheral edema. Atraumatic. No cyanosis SKIN:  Clear with no obvious rashes or skin changes.  NEURO:  Nonfocal. Well oriented.  Appropriate affect.  Pelvic: exam chaperoned by CMA;  Vulva: normal appearing vulva with no masses, tenderness or lesions; Vagina: normal vagina; vaginal nodularity located midline to right fornix ~ 8 mm. No visual abnormalities; Uterus: surgically absent, vaginal cuff well healed; Cervix: surgically absent. Rectal deferred  Lab Review: n/a  Radiologic Imaging: n/a    Assessment:  Madison [name] is a 39 y.o. female diagnosed with probably stage IB1cervical cancer,  adenocarcinoma, grade 2, s/p radical hysterectomy, bilateral SLN negative margins, negative parameteria and negative LVSI in 05/2016. Pap negative in 2019. No evidence of disease on exam today and asymptomatic.    Vaginal nodularity of uncertain significance, stable  Medical co-morbidities complicating care: obesity and prior abdominal surgery. Body mass index is 37.97 kg/m.  Plan:   Problem List Items Addressed This Visit      Other   Cervical cancer, FIGO stage IB1 ([place]) - Primary     She is now 4 years out from her diagnosis.  We  reviewed NCCN guidelines for surveillance including pelvic exams every 6 to 12 months for years 4 and 5 and then annually thereafter.  We again reviewed that cervical/vaginal cytology may be considered annually but does not to have limited value in detecting recurrent cervical cancer.  If she becomes symptomatic would consider imaging.  Reviewed survivorship topics including healthy lifestyle and maintenance of healthy BMI, exercise, nutrition, sexual health, vaginal lubricants and moisturizers as indicated.  Plan to return to clinic in 6 months for reevaluation and pelvic exam or sooner if concerning symptoms.  If she wishes to reestablish care with Dr. Glennon Mac we can consider alternating visits  The patient's diagnosis, an outline of the further diagnostic and laboratory studies which will be required, the recommendation, and alternatives were discussed.  All questions were answered to the patient's satisfaction.  A total of 25 minutes were spent with the patient/family today; >50% was spent in education, counseling and coordination of care for cervical cancer.  Thank you for allowing me to participate in the care of this very pleasant patient    Beckey Rutter, [place], AGNP-C [place] [facility] [phone] (clinic)

## 2020-05-13 ENCOUNTER — Ambulatory Visit: Payer: BC Managed Care – PPO

## 2023-09-12 ENCOUNTER — Ambulatory Visit: Payer: 59 | Admitting: Pediatrics

## 2023-09-12 ENCOUNTER — Encounter: Payer: Self-pay | Admitting: Pediatrics

## 2023-09-12 VITALS — BP 109/73 | HR 73 | Temp 98.7°F | Resp 15 | Ht 63.78 in | Wt 209.8 lb

## 2023-09-12 DIAGNOSIS — G4700 Insomnia, unspecified: Secondary | ICD-10-CM | POA: Insufficient documentation

## 2023-09-12 DIAGNOSIS — Z131 Encounter for screening for diabetes mellitus: Secondary | ICD-10-CM

## 2023-09-12 DIAGNOSIS — F411 Generalized anxiety disorder: Secondary | ICD-10-CM

## 2023-09-12 DIAGNOSIS — Z1231 Encounter for screening mammogram for malignant neoplasm of breast: Secondary | ICD-10-CM

## 2023-09-12 DIAGNOSIS — Z1330 Encounter for screening examination for mental health and behavioral disorders, unspecified: Secondary | ICD-10-CM | POA: Diagnosis not present

## 2023-09-12 DIAGNOSIS — Z13220 Encounter for screening for lipoid disorders: Secondary | ICD-10-CM

## 2023-09-12 DIAGNOSIS — Z7689 Persons encountering health services in other specified circumstances: Secondary | ICD-10-CM

## 2023-09-12 MED ORDER — HYDROXYZINE HCL 10 MG PO TABS: 10.0000 mg | ORAL_TABLET | Freq: Three times a day (TID) | ORAL | 1 refills | Status: DC | PRN

## 2023-09-12 MED ORDER — ESCITALOPRAM OXALATE 10 MG PO TABS: 10.0000 mg | ORAL_TABLET | Freq: Every day | ORAL | 2 refills | Status: DC

## 2023-09-12 NOTE — Assessment & Plan Note (Signed)
Reports of frequent nights with only 3-4 hours of sleep, waking up early, and feeling groggy. -Address insomnia as part of anxiety management plan with Lexapro and Hydroxyzine. -Consider adding Trazodone if sleep does not improve with anxiety management.

## 2023-09-12 NOTE — Progress Notes (Signed)
Establish Care Note  BP 109/73 (BP Location: Left Arm, Patient Position: Sitting, Cuff Size: Large)   Pulse 73   Temp 98.7 F (37.1 C) (Oral)   Resp 15   Ht 5' 3.78" (1.62 m)   Wt 209 lb 12.8 oz (95.2 kg)   LMP 05/07/2016 (Exact Date)   SpO2 97%   BMI 36.26 kg/m    Subjective:    Patient ID: Madison [name], female    DOB: September 12, 1980, 43 y.o.   MRN: [id number]  HPI: Madison [name] is a 43 y.o. female  Chief Complaint  Patient presents with   Establish Care    Anxiety- ongoing for adult life. Previously on medications. Lexapro and hydroxizine     Establishing care, the following was discussed today:  Discussed the use of AI scribe software for clinical note transcription with the patient, who gave verbal consent to proceed.  History of Present Illness   The patient, previously under the care of Dr. Laural Benes, has recently reestablished care after a period without insurance. 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 The patient has a history of anxiety, previously managed with Lexapro, which she self-discontinued when she felt better. Over the past year and a half, she has experienced increasing anxiety symptoms, characterized by persistent overthinking and a constant pit in her stomach. This has begun to affect her sleep, with some nights only achieving three to four hours of sleep. She wakes up early and can return to sleep, but then feels groggy throughout the day. She denies any symptoms of depression.  In the past, the patient found Lexapro effective and also took hydroxyzine as needed. She reports that hydroxyzine did make her sleepy.  The patient underwent a radical hysterectomy in 2017 and has completed five years of follow-up without any noted issues. She has no known family history of thyroid issues or colon cancer. The patient uses the [facility] in [place].     #HM Will review HM records and updated as  needed.  Relevant past medical, surgical, family and social history reviewed and updated as indicated. Interim medical history since our last visit reviewed. Allergies and medications reviewed and updated.  ROS per HPI unless specifically indicated above     Objective:    BP 109/73 (BP Location: Left Arm, Patient Position: Sitting, Cuff Size: Large)   Pulse 73   Temp 98.7 F (37.1 C) (Oral)   Resp 15   Ht 5' 3.78" (1.62 m)   Wt 209 lb 12.8 oz (95.2 kg)   LMP 05/07/2016 (Exact Date)   SpO2 97%   BMI 36.26 kg/m   Wt Readings from Last 3 Encounters:  09/12/23 209 lb 12.8 oz (95.2 kg)  05/12/20 207 lb 9.6 oz (94.2 kg)  11/06/19 185 lb (83.9 kg)     Physical Exam Constitutional:      Appearance: Normal appearance.  HENT:     Head: Normocephalic and atraumatic.  Eyes:     Pupils: Pupils are equal, round, and reactive to light.  Cardiovascular:     Rate and Rhythm: Normal rate and regular rhythm.     Pulses: Normal pulses.     Heart sounds: Normal heart sounds.  Pulmonary:     Effort: Pulmonary effort is normal.     Breath sounds: Normal breath sounds.  Musculoskeletal:        General: Normal range of motion.     Cervical back: Normal range of motion.  Skin:    General: Skin is warm and dry.     Capillary Refill: Capillary refill takes less than 2 seconds.  Neurological:     General: No focal deficit present.     Mental Status: She is alert. Mental status is at baseline.  Psychiatric:        Mood and Affect: Mood normal.        Behavior: Behavior normal.         09/12/2023   10:30 AM 04/08/2019    8:34 AM 03/04/2019   12:03 PM 07/10/2018    1:07 PM 09/16/2016    8:46 AM  Depression screen PHQ 2/9  Decreased Interest 0 0 0 0 0  Down, Depressed, Hopeless 0 0 0 0 0  PHQ - 2 Score 0 0 0 0 0  Altered sleeping 2 0 0 0 0  Tired, decreased energy 2 0 1 0 0  Change in appetite 3 0 3 0 0  Feeling bad or failure about yourself  0 0 0 0 0  Trouble concentrating 0 0 0 0  0  Moving slowly or fidgety/restless 0 0 0 0 0  Suicidal thoughts 0 0 0 0 0  PHQ-9 Score 7 0 4 0 0  Difficult doing work/chores Somewhat difficult Not difficult at all Somewhat difficult Not difficult at all         09/12/2023   10:30 AM 05/13/2019   11:07 AM 04/08/2019    8:35 AM 03/04/2019   12:04 PM  GAD 7 : Generalized Anxiety Score  Nervous, Anxious, on Edge 3 0 0 3  Control/stop worrying 1 0 0 3  Worry too much - different things 1 0 0 3  Trouble relaxing 0 0 0 2  Restless 0 0 0 0  Easily annoyed or irritable 1 0 0 2  Afraid - awful might happen 0 0 0 1  Total GAD 7 Score 6 0 0 14  Anxiety Difficulty Somewhat difficult  Not difficult at all Somewhat difficult       Assessment & Plan:  Assessment & Plan   Generalized anxiety disorder Assessment & Plan: Reports of persistent overthinking, sleep disturbances, and physical discomfort. Previously managed with Lexapro and Hydroxyzine with good response. No history of depression. -Restart Lexapro at 5mg  for one week, then increase to 10mg  daily. -Consider Hydroxyzine as needed for breakthrough symptoms. -Check thyroid function with lab work to rule out any thyroid-related issues contributing to anxiety symptoms. -Encourage patient to consider therapy as an adjunct to medication management.  Orders: -     CBC -     Comprehensive metabolic panel -     TSH -     Escitalopram Oxalate; Take 1 tablet (10 mg total) by mouth daily.  Dispense: 30 tablet; Refill: 2 -     hydrOXYzine HCl; Take 1 tablet (10 mg total) by mouth 3 (three) times daily as needed.  Dispense: 270 tablet; Refill: 1  Insomnia, unspecified type Assessment & Plan: Reports of frequent nights with only 3-4 hours of sleep, waking up early, and feeling groggy. -Address insomnia as part of anxiety management plan with Lexapro and Hydroxyzine. -Consider adding Trazodone if sleep does not improve with anxiety management.  Encounter for behavioral health screening As  part of their intake evaluation, the patient was screened for depression, anxiety.  PHQ9 SCORE 7, GAD7 SCORE 6. Screening results positive for tested conditions. See plan under problem/diagnosis above.  Encounter to establish care Reviewed available patient record including  history, medications, problem list. HM updated as able. Will review and/or request outside records (if applicable) and will fill remaining HM gaps as needed at follow up visit.  Diabetes mellitus screening -     Hemoglobin A1c  Lipid screening -     Lipid panel  Encounter for screening mammogram for malignant neoplasm of breast -     3D Screening Mammogram, Left and Right; Future  Follow up plan: Return in about 4 weeks (around 10/10/2023) for Physical, Mood.  Mischelle Reeg Howell Pringle, MD

## 2023-09-12 NOTE — Assessment & Plan Note (Signed)
Reports of persistent overthinking, sleep disturbances, and physical discomfort. Previously managed with Lexapro and Hydroxyzine with good response. No history of depression. -Restart Lexapro at 5mg  for one week, then increase to 10mg  daily. -Consider Hydroxyzine as needed for breakthrough symptoms. -Check thyroid function with lab work to rule out any thyroid-related issues contributing to anxiety symptoms. -Encourage patient to consider therapy as an adjunct to medication management.

## 2023-09-12 NOTE — Patient Instructions (Addendum)
Lexapro 10mg , take 1/2 tab for the first 7 days Hydroxyzine as needed  You have an order for:  []   2D Mammogram  [x]   3D Mammogram  []   Bone Density     Please call for appointment:  [facility]  [address] [place] [address] [phone] [facility] [address] [place], [place] [address] [phone] [facility] [address] Geanie Logan [place], [place] [address] [phone]   Make sure to wear two-piece clothing.  No lotions, powders, or deodorants the day of the appointment. Make sure to bring picture ID and insurance card.  Bring list of medications you are currently taking including any supplements.   Schedule your [place] screening mammogram through MyChart!   Log into your MyChart account.  Go to 'Visit' (or 'Appointments' if on mobile App) --> Schedule an Appointment  Under 'Select a Reason for Visit' choose the Mammogram Screening option.  Complete the pre-visit questions and select the time and place that best fits your schedule.   Good to meet you! Welcome to [facility]!  As your primary care doctor, I look forward to working with you to help you reach your health goals.  Please be aware of a couple of logistical items: - If you message me on mychart, it may take me 1-2 business days to get back to you. This is for non-urgent messaging.  - If you require urgent clinical attention, please call the clinic or present to urgent care/emergency room - If you have labs, I typically will send a message about them in 1-2 business days. - I am not here on Mondays, otherwise will be available from Tuesday-Friday during 8a-5pm.

## 2023-09-13 LAB — TSH: TSH: 2.02 u[IU]/mL (ref 0.450–4.500)

## 2023-09-13 LAB — COMPREHENSIVE METABOLIC PANEL
ALT: 36 [IU]/L — ABNORMAL HIGH (ref 0–32)
AST: 31 [IU]/L (ref 0–40)
Albumin: 4.4 g/dL (ref 3.9–4.9)
Alkaline Phosphatase: 84 [IU]/L (ref 44–121)
BUN/Creatinine Ratio: 11 (ref 9–23)
BUN: 8 mg/dL (ref 6–24)
Bilirubin Total: 0.5 mg/dL (ref 0.0–1.2)
CO2: 20 mmol/L (ref 20–29)
Calcium: 9.5 mg/dL (ref 8.7–10.2)
Chloride: 99 mmol/L (ref 96–106)
Creatinine, Ser: 0.74 mg/dL (ref 0.57–1.00)
Globulin, Total: 2.7 g/dL (ref 1.5–4.5)
Glucose: 91 mg/dL (ref 70–99)
Potassium: 4.3 mmol/L (ref 3.5–5.2)
Sodium: 137 mmol/L (ref 134–144)
Total Protein: 7.1 g/dL (ref 6.0–8.5)
eGFR: 104 mL/min/{1.73_m2} (ref >59)

## 2023-09-13 LAB — CBC
Hematocrit: 43.8 % (ref 34.0–46.6)
Hemoglobin: 15 g/dL (ref 11.1–15.9)
MCH: 30.1 pg (ref 26.6–33.0)
MCHC: 34.2 g/dL (ref 31.5–35.7)
MCV: 88 fL (ref 79–97)
Platelets: 396 10*3/uL (ref 150–450)
RBC: 4.99 x10E6/uL (ref 3.77–5.28)
RDW: 13.2 % (ref 11.7–15.4)
WBC: 11.9 10*3/uL — ABNORMAL HIGH (ref 3.4–10.8)

## 2023-09-13 LAB — HEMOGLOBIN A1C
Est. average glucose Bld gHb Est-mCnc: 120 mg/dL
Hgb A1c MFr Bld: 5.8 % — ABNORMAL HIGH (ref 4.8–5.6)

## 2023-09-13 LAB — LIPID PANEL
Chol/HDL Ratio: 7.6 {ratio} — ABNORMAL HIGH (ref 0.0–4.4)
Cholesterol, Total: 251 mg/dL — ABNORMAL HIGH (ref 100–199)
HDL: 33 mg/dL — ABNORMAL LOW (ref >39)
LDL Chol Calc (NIH): 143 mg/dL — ABNORMAL HIGH (ref 0–99)
Triglycerides: 406 mg/dL — ABNORMAL HIGH (ref 0–149)
VLDL Cholesterol Cal: 75 mg/dL — ABNORMAL HIGH (ref 5–40)

## 2023-10-12 ENCOUNTER — Encounter: Payer: 59 | Admitting: Pediatrics

## 2023-10-18 ENCOUNTER — Ambulatory Visit: Payer: 59 | Admitting: Pediatrics

## 2023-10-18 ENCOUNTER — Encounter: Payer: Self-pay | Admitting: Pediatrics

## 2023-10-18 VITALS — BP 128/84 | HR 79 | Temp 98.9°F | Resp 16 | Ht 63.78 in | Wt 210.0 lb

## 2023-10-18 DIAGNOSIS — Z1330 Encounter for screening examination for mental health and behavioral disorders, unspecified: Secondary | ICD-10-CM

## 2023-10-18 DIAGNOSIS — F411 Generalized anxiety disorder: Secondary | ICD-10-CM | POA: Diagnosis not present

## 2023-10-18 DIAGNOSIS — Z0000 Encounter for general adult medical examination without abnormal findings: Secondary | ICD-10-CM

## 2023-10-18 DIAGNOSIS — D72829 Elevated white blood cell count, unspecified: Secondary | ICD-10-CM

## 2023-10-18 DIAGNOSIS — Z8541 Personal history of malignant neoplasm of cervix uteri: Secondary | ICD-10-CM

## 2023-10-18 DIAGNOSIS — Z1159 Encounter for screening for other viral diseases: Secondary | ICD-10-CM

## 2023-10-18 NOTE — Progress Notes (Unsigned)
 LMP 05/07/2016 (Exact Date)    Annual Physical Exam - Female  Subjective:   CC: Annual Exam and Mood (A lot better. Medication seems to be helping, no side effects )   Madison [name] is a 43 y.o. female patient here for a preventative health maintenance exam and has no acute complaints.  Health Habits: DIET: in general, an "unhealthy" diet EXERCISE: limited, working to establish routine DENTAL EXAM: Up to Date EYE EXAM: Up to Date                       Relevant Gynecologic History LMP: Patient's last menstrual period was 05/07/2016 (exact date).  Menstrual Status: premenopausal, Flow regular every month without intermenstrual spotting PAP History:  Result Date Procedure Results Follow-ups  11/06/2019 IGP, Aptima HPV Interpretation: NILM,QC Category: NIL Adequacy: SDER Clinician Provided ICD10: Comment Performed by:: Comment QC reviewed by:: Comment Note:: Comment Test Methodology: Comment HPV Aptima: Negative   07/10/2018 Cytology - PAP Adequacy: Satisfactory for evaluation. Diagnosis: NEGATIVE FOR INTRAEPITHELIAL LESIONS OR MALIGNANCY. HPV: NOT DETECTED Material Submitted: Vaginal Pap [ThinPrep Imaged] CYTOLOGY - PAP: PAP RESULT   09/13/2017 Pap liquid-based and HPV (high risk) DIAGNOSIS:: Comment Specimen adequacy:: Comment Performed by:: Comment PAP Smear Comment: . Note:: Comment HPV, high-risk: Negative   04/28/2016 Surgical pathology SURGICAL PATHOLOGY: Surgical Pathology CASE: [id number] PATIENT: Madison [name] Surgical Pathology Report     SPECIMEN SUBMITTED: A. Cervical conization; biopsy, stitch at 12 o'clock B. Endocervical curettings  CLINICAL HISTORY: None provided  PRE-OPERATIVE...     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  Last recommendation 05/12/20  when she was 4 years out from dx: pelvic exams every 6 to 12 months for years 4 and 5 and then annually thereafter. We again reviewed that cervical/vaginal cytology may be considered annually but does not to have limited value in detecting recurrent cervical cancer. If she becomes symptomatic would consider imaging.   Sexual activity: single partner, contraception - status post hysterectomy Family history breast, ovarian cancer: Yes, ovarian cancer in dad's mom Domestic Violence Screen, feels safe at home: Yes  family history includes Cervical cancer in her mother; Diabetes Mellitus II in her father; Heart disease in her father; Lung cancer in her maternal grandmother; Ovarian cancer in her paternal grandmother.  Social History   Tobacco Use   Smoking status: Never    Passive exposure: Past   Smokeless tobacco: Never  Vaping Use   Vaping status: Never Used  Substance Use Topics   Alcohol use: No   Drug use: Never   Social History   Social History Narrative   Not on file    Social drivers questionnaire is reviewed and is positive for : none.  Depression Screening:     10/18/2023    4:16 PM 09/12/2023   10:30 AM 04/08/2019    8:34 AM 03/04/2019   12:03 PM 07/10/2018    1:07 PM  Depression screen PHQ 2/9  Decreased Interest 0 0 0 0 0  Down, Depressed, Hopeless 0 0 0 0 0  PHQ - 2 Score 0 0 0 0 0  Altered sleeping 3 2 0 0 0  Tired, decreased energy 3 2 0 1 0  Change in appetite 3 3 0 3 0  Feeling bad or failure about yourself  0 0 0 0 0  Trouble concentrating 0 0  0 0 0  Moving slowly or fidgety/restless 0 0 0 0 0  Suicidal thoughts 0 0 0 0 0  PHQ-9 Score 9 7 0 4 0  Difficult doing work/chores Somewhat difficult Somewhat difficult Not difficult at all Somewhat difficult Not difficult at all       10/18/2023    4:16 PM 09/12/2023   10:30 AM 05/13/2019   11:07 AM 04/08/2019    8:35 AM  GAD 7 : Generalized Anxiety Score  Nervous, Anxious, on Edge 0 3 0 0  Control/stop  worrying 0 1 0 0  Worry too much - different things 0 1 0 0  Trouble relaxing 0 0 0 0  Restless 0 0 0 0  Easily annoyed or irritable 0 1 0 0  Afraid - awful might happen 0 0 0 0  Total GAD 7 Score 0 6 0 0  Anxiety Difficulty Not difficult at all Somewhat difficult  Not difficult at all    Mental Health Plan:  see plan below  Self Management Goals  Goals   None     Health Maintenance Colon Cancer Screening : Not applicable Mammogram : Due DXA scan : Not applicable Immunizations : declined  Review of Systems See HPI for relevant ROS.  Outpatient Medications Prior to Visit  Medication Sig Dispense Refill   acetaminophen (TYLENOL) 325 MG tablet Take by mouth.     escitalopram (LEXAPRO) 10 MG tablet Take 1 tablet (10 mg total) by mouth daily. 30 tablet 2   hydrOXYzine (ATARAX) 10 MG tablet Take 1 tablet (10 mg total) by mouth 3 (three) times daily as needed. 270 tablet 1   No facility-administered medications prior to visit.     Patient Active Problem List   Diagnosis Date Noted   Insomnia 09/12/2023   Family history of ovarian cancer 07/10/2018   Anxiety disorder 07/11/2016   History of cervical cancer 05/11/2016    Objective:   There were no vitals filed for this visit.  There is no height or weight on file to calculate BMI.  Physical Exam Constitutional:      Appearance: Normal appearance.  HENT:     Head: Normocephalic and atraumatic.  Eyes:     Pupils: Pupils are equal, round, and reactive to light.  Cardiovascular:     Rate and Rhythm: Normal rate and regular rhythm.     Pulses: Normal pulses.     Heart sounds: Normal heart sounds.  Pulmonary:     Effort: Pulmonary effort is normal.     Breath sounds: Normal breath sounds.  Abdominal:     General: Abdomen is flat.     Palpations: Abdomen is soft.  Musculoskeletal:        General: Normal range of motion.     Cervical back: Normal range of motion.  Skin:    General: Skin is warm and dry.      Capillary Refill: Capillary refill takes less than 2 seconds.  Neurological:     General: No focal deficit present.     Mental Status: She is alert. Mental status is at baseline.  Psychiatric:        Mood and Affect: Mood normal.        Behavior: Behavior normal.    Assessment and Plan:   Physical exam Discussed lifestyle modifications and goals including plant based eating styles (such as: Mediterranean eating style), regular exercise (at least 150 min of moderate-intensity aerobic exercise per week, given AHA workout handout), get adequate  sleep, and continue working with PCP towards meeting health goals to ensure healthy aging. Cervical cancer survivor see screening as below.   History of cervical cancer Assessment & Plan: Last recommendation 05/12/20 when she was 4 years out from dx: pelvic exams every 6 to 12 months for years 4 and 5 and then annually thereafter. We again reviewed that cervical/vaginal cytology may be considered annually but does not to have limited value in detecting recurrent cervical cancer. If she becomes symptomatic would consider imaging. Discuss re-establishing with gyn for monitoring. Does have cervical cuff intact.  Generalized anxiety disorder Assessment & Plan: Started on lexapro last visit. Tolerating well and working well. Using prn hydroxzine. Continue current plan. Follow up in 2 months.  Encounter for behavioral health screening As part of their intake evaluation, the patient was screened for depression, anxiety.  PHQ9 SCORE 9, GAD7 SCORE 0. Screening results positive for tested conditions. See plan under problem/diagnosis above.   Leukocytosis, unspecified type Noted on recent blood work. Plan to repeat -     CBC with Differential/Platelet  Encounter for hepatitis C screening test for low risk patient -     Hepatitis C antibody  This plan was discussed with the patient and questions were answered. There were no further concerns.  Follow up  as indicated, or sooner should any new problems arise, if conditions worsen, or if they are otherwise concerned.   See patient instructions for additional information.  Jackolyn Confer, MD  Family Medicine      Future Appointments  Date Time Provider Department Center  12/19/2023  4:00 PM Jackolyn Confer, MD [facility]

## 2023-10-18 NOTE — Patient Instructions (Signed)
 You have an order for:  []   2D Mammogram  [x]   3D Mammogram  []   Bone Density     Please call for appointment:  [facility]  [address] [place] [address] [phone] [facility] [address] [place], [place] [address] [phone] [facility] [address] Geanie Logan [place], [place] [address] [phone]   Make sure to wear two-piece clothing.  No lotions, powders, or deodorants the day of the appointment. Make sure to bring picture ID and insurance card.  Bring list of medications you are currently taking including any supplements.   Schedule your [place] screening mammogram through MyChart!   Log into your MyChart account.  Go to 'Visit' (or 'Appointments' if on mobile App) --> Schedule an Appointment  Under 'Select a Reason for Visit' choose the Mammogram Screening option.  Complete the pre-visit questions and select the time and place that best fits your schedule.

## 2023-10-20 ENCOUNTER — Other Ambulatory Visit: Payer: Self-pay | Admitting: Pediatrics

## 2023-10-20 DIAGNOSIS — D72828 Other elevated white blood cell count: Secondary | ICD-10-CM

## 2023-10-20 NOTE — Progress Notes (Signed)
 Placed order for hematology for persistent neutrophilia.  Jackolyn Confer, MD

## 2023-10-22 ENCOUNTER — Encounter: Payer: Self-pay | Admitting: Pediatrics

## 2023-10-22 NOTE — Assessment & Plan Note (Signed)
 Started on lexapro last visit. Tolerating well and working well. Using prn hydroxzine. Continue current plan. Follow up in 2 months.

## 2023-10-22 NOTE — Assessment & Plan Note (Signed)
 Last recommendation 05/12/20 when she was 4 years out from dx: pelvic exams every 6 to 12 months for years 4 and 5 and then annually thereafter. We again reviewed that cervical/vaginal cytology may be considered annually but does not to have limited value in detecting recurrent cervical cancer. If she becomes symptomatic would consider imaging. Discuss re-establishing with gyn for monitoring. Does have cervical cuff intact.

## 2023-10-23 ENCOUNTER — Encounter: Payer: Self-pay | Admitting: Pediatrics

## 2023-10-25 ENCOUNTER — Inpatient Hospital Stay

## 2023-10-25 ENCOUNTER — Other Ambulatory Visit

## 2023-10-25 ENCOUNTER — Inpatient Hospital Stay: Attending: Oncology | Admitting: Oncology

## 2023-10-25 ENCOUNTER — Encounter: Payer: Self-pay | Admitting: Oncology

## 2023-10-25 VITALS — BP 131/90 | HR 80 | Temp 97.8°F | Resp 16 | Ht 63.78 in | Wt 211.0 lb

## 2023-10-25 DIAGNOSIS — D72828 Other elevated white blood cell count: Secondary | ICD-10-CM | POA: Diagnosis not present

## 2023-10-25 DIAGNOSIS — Z79899 Other long term (current) drug therapy: Secondary | ICD-10-CM | POA: Diagnosis not present

## 2023-10-25 DIAGNOSIS — Z8041 Family history of malignant neoplasm of ovary: Secondary | ICD-10-CM | POA: Diagnosis not present

## 2023-10-25 DIAGNOSIS — Z90722 Acquired absence of ovaries, bilateral: Secondary | ICD-10-CM | POA: Diagnosis not present

## 2023-10-25 DIAGNOSIS — Z8541 Personal history of malignant neoplasm of cervix uteri: Secondary | ICD-10-CM | POA: Insufficient documentation

## 2023-10-25 DIAGNOSIS — Z90710 Acquired absence of both cervix and uterus: Secondary | ICD-10-CM | POA: Diagnosis not present

## 2023-10-25 DIAGNOSIS — Z801 Family history of malignant neoplasm of trachea, bronchus and lung: Secondary | ICD-10-CM | POA: Diagnosis not present

## 2023-10-25 DIAGNOSIS — D72829 Elevated white blood cell count, unspecified: Secondary | ICD-10-CM | POA: Insufficient documentation

## 2023-10-25 LAB — CBC WITH DIFFERENTIAL/PLATELET
Abs Immature Granulocytes: 0.04 10*3/uL (ref 0.00–0.07)
Basophils Absolute: 0.1 10*3/uL (ref 0.0–0.1)
Basophils Relative: 1 %
Eosinophils Absolute: 0.3 10*3/uL (ref 0.0–0.5)
Eosinophils Relative: 3 %
HCT: 40.1 % (ref 36.0–46.0)
Hemoglobin: 14.3 g/dL (ref 12.0–15.0)
Immature Granulocytes: 0 %
Lymphocytes Relative: 39 %
Lymphs Abs: 4 10*3/uL (ref 0.7–4.0)
MCH: 30.2 pg (ref 26.0–34.0)
MCHC: 35.7 g/dL (ref 30.0–36.0)
MCV: 84.6 fL (ref 80.0–100.0)
Monocytes Absolute: 0.6 10*3/uL (ref 0.1–1.0)
Monocytes Relative: 6 %
Neutro Abs: 5.2 10*3/uL (ref 1.7–7.7)
Neutrophils Relative %: 51 %
Platelets: 388 10*3/uL (ref 150–400)
RBC: 4.74 MIL/uL (ref 3.87–5.11)
RDW: 12.5 % (ref 11.5–15.5)
WBC: 10.2 10*3/uL (ref 4.0–10.5)
nRBC: 0 % (ref 0.0–0.2)

## 2023-10-25 LAB — IRON AND TIBC
Iron: 75 ug/dL (ref 28–170)
Saturation Ratios: 22 % (ref 10.4–31.8)
TIBC: 342 ug/dL (ref 250–450)
UIBC: 267 ug/dL

## 2023-10-25 LAB — FOLATE: Folate: 29 ng/mL (ref >5.9)

## 2023-10-25 LAB — FERRITIN: Ferritin: 145 ng/mL (ref 11–307)

## 2023-10-25 LAB — VITAMIN B12: Vitamin B-12: 423 pg/mL (ref 180–914)

## 2023-10-25 NOTE — Progress Notes (Signed)
 [facility]  [phone] [phone]) [phone]  ID: Madison [name] OB: 1981/06/06  MR#: [id number]  AOZ#:[id number]  Patient Care Team: Jackolyn Confer, MD as PCP - General (Family Medicine) Benita Gutter, RN as Registered Nurse  CHIEF COMPLAINT: Neutrophilia.  INTERVAL HISTORY: Patient is a 43 year old female with a history of stage I cervical cancer was noted to have a persistently elevated white blood cell count with neutrophil predominance on routine blood work.  She currently feels well and is asymptomatic.  She has no neurologic complaints.  She denies any recent fevers or illnesses.  She has a good appetite and denies weight loss.  She has no chest pain, shortness of breath, cough, or hemoptysis.  She denies any nausea, vomiting, constipation, or diarrhea.  She has no urinary complaints.  Patient feels at her baseline and offers no specific complaints today.  REVIEW OF SYSTEMS:   Review of Systems  Constitutional: Negative.  Negative for fever, malaise/fatigue and weight loss.  Respiratory: Negative.  Negative for cough, hemoptysis and shortness of breath.   Cardiovascular: Negative.  Negative for chest pain and leg swelling.  Gastrointestinal: Negative.  Negative for abdominal pain.  Genitourinary: Negative.  Negative for dysuria.  Musculoskeletal: Negative.  Negative for back pain.  Skin: Negative.  Negative for rash.  Neurological: Negative.  Negative for dizziness, focal weakness, weakness and headaches.  Psychiatric/Behavioral: Negative.  The patient is not nervous/anxious.     As per HPI. Otherwise, a complete review of systems is negative.  PAST MEDICAL HISTORY: Past Medical History:  Diagnosis Date   Acute right eye pain 06/10/2016   Allergy 2012   Adhesive   Anxiety    Cancer (HCC) 2017   Cervical   Depression     PAST SURGICAL HISTORY: Past Surgical History:  Procedure Laterality Date   ABDOMINAL HYSTERECTOMY  2017    BREAST ENHANCEMENT SURGERY Bilateral    BREAST SURGERY     CERVICAL CONIZATION W/BX N/A 04/28/2016   Procedure: CONIZATION CERVIX WITH BIOPSY;  Surgeon: Conard Novak, MD;  Location: [facility] ORS;  Service: Gynecology;  Laterality: N/A;   CESAREAN SECTION     CESAREAN SECTION WITH BILATERAL TUBAL LIGATION Bilateral 12/08/2015   Procedure: CESAREAN SECTION WITH BILATERAL TUBAL LIGATION;  Surgeon: Conard Novak, MD;  Location: [facility] OR[facility];  Service: Obstetrics;  Laterality: Bilateral;   COSMETIC SURGERY  2005   Breast lift/liposuction   DILATION AND CURETTAGE OF UTERUS     ROBOTIC ASSISTED LAPAROSCOPIC HYSTERECTOMY AND SALPINGECTOMY  2017   TUBAL LIGATION  2017    FAMILY HISTORY: Family History  Problem Relation Age of Onset   Cervical cancer Mother    Diabetes Mellitus II Father    Heart disease Father    Lung cancer Maternal Grandmother    Ovarian cancer Paternal Grandmother     ADVANCED DIRECTIVES (Y/N):  N  HEALTH MAINTENANCE: Social History   Tobacco Use   Smoking status: Never    Passive exposure: Past   Smokeless tobacco: Never  Vaping Use   Vaping status: Never Used  Substance Use Topics   Alcohol use: No   Drug use: Never     Colonoscopy:  PAP:  Bone density:  Lipid panel:  Allergies  Allergen Reactions   Tape Other (See Comments)    Burns skin    Current Outpatient Medications  Medication Sig Dispense Refill   acetaminophen (TYLENOL) 325 MG tablet Take by mouth.     escitalopram (LEXAPRO) 10 MG  tablet Take 1 tablet (10 mg total) by mouth daily. 30 tablet 2   hydrOXYzine (ATARAX) 10 MG tablet Take 1 tablet (10 mg total) by mouth 3 (three) times daily as needed. 270 tablet 1   No current facility-administered medications for this visit.    OBJECTIVE: Vitals:   10/25/23 1352  BP: (!) 131/90  Pulse: 80  Resp: 16  Temp: 97.8 F (36.6 C)  SpO2: 94%     Body mass index is 36.47 kg/m.    ECOG FS:0 - Asymptomatic  General: Well-developed,  well-nourished, no acute distress. Eyes: Pink conjunctiva, anicteric sclera. HEENT: Normocephalic, moist mucous membranes. Lungs: No audible wheezing or coughing. Heart: Regular rate and rhythm. Abdomen: Soft, nontender, no obvious distention. Musculoskeletal: No edema, cyanosis, or clubbing. Neuro: Alert, answering all questions appropriately. Cranial nerves grossly intact. Skin: No rashes or petechiae noted. Psych: Normal affect. Lymphatics: No cervical, calvicular, axillary or inguinal LAD.   LAB RESULTS:  Lab Results  Component Value Date   NA 137 09/12/2023   K 4.3 09/12/2023   CL 99 09/12/2023   CO2 20 09/12/2023   GLUCOSE 91 09/12/2023   BUN 8 09/12/2023   CREATININE 0.74 09/12/2023   CALCIUM 9.5 09/12/2023   PROT 7.1 09/12/2023   ALBUMIN 4.4 09/12/2023   AST 31 09/12/2023   ALT 36 (H) 09/12/2023   ALKPHOS 84 09/12/2023   BILITOT 0.5 09/12/2023   GFRNONAA 104 03/04/2019   GFRAA 120 03/04/2019    Lab Results  Component Value Date   WBC 10.2 10/25/2023   NEUTROABS 5.2 10/25/2023   HGB 14.3 10/25/2023   HCT 40.1 10/25/2023   MCV 84.6 10/25/2023   PLT 388 10/25/2023     STUDIES: No results found.  ASSESSMENT: Neutrophilia.  PLAN:    Neutrophilia: Resolved.  Patient's total white blood cell count as well as her differential including neutrophils are within normal limits today.  Have ordered peripheral blood flow cytometry as well as BCR-ABL mutation for completeness.  These are pending at time of dictation.  No intervention is needed.  Patient does not require bone marrow biopsy.  She will have a video-assisted telemedicine visit in 3 weeks to discuss the results.  I spent a total of 45 minutes reviewing chart data, face-to-face evaluation with the patient, counseling and coordination of care as detailed above.   Patient expressed understanding and was in agreement with this plan. She also understands that She can call clinic at any time with any questions,  concerns, or complaints.     Jeralyn Ruths, MD   10/25/2023 4:01 PM

## 2023-10-26 LAB — CBC WITH DIFFERENTIAL/PLATELET
Basophils Absolute: 0.1 10*3/uL (ref 0.0–0.2)
Basos: 1 %
EOS (ABSOLUTE): 0.3 10*3/uL (ref 0.0–0.4)
Eos: 2 %
Hematocrit: 41 % (ref 34.0–46.6)
Hemoglobin: 14.1 g/dL (ref 11.1–15.9)
Immature Grans (Abs): 0 10*3/uL (ref 0.0–0.1)
Immature Granulocytes: 0 %
Lymphocytes Absolute: 4.8 10*3/uL — ABNORMAL HIGH (ref 0.7–3.1)
Lymphs: 35 %
MCH: 30.3 pg (ref 26.6–33.0)
MCHC: 34.4 g/dL (ref 31.5–35.7)
MCV: 88 fL (ref 79–97)
Monocytes Absolute: 0.8 10*3/uL (ref 0.1–0.9)
Monocytes: 6 %
Neutrophils Absolute: 7.5 10*3/uL — ABNORMAL HIGH (ref 1.4–7.0)
Neutrophils: 56 %
Platelets: 405 10*3/uL (ref 150–450)
RBC: 4.65 x10E6/uL (ref 3.77–5.28)
RDW: 13.1 % (ref 11.7–15.4)
WBC: 13.6 10*3/uL — ABNORMAL HIGH (ref 3.4–10.8)

## 2023-10-26 LAB — HEPATITIS C ANTIBODY

## 2023-10-30 LAB — COMP PANEL: LEUKEMIA/LYMPHOMA

## 2023-10-31 LAB — BCR-ABL1, CML/ALL, PCR, QUANT
E1A2 Transcript: <0.0032 %
Interpretation (BCRAL):: NEGATIVE
b2a2 transcript: <0.0032 %
b3a2 transcript: <0.0032 %

## 2023-11-15 ENCOUNTER — Inpatient Hospital Stay: Admitting: Oncology

## 2023-11-15 DIAGNOSIS — D72828 Other elevated white blood cell count: Secondary | ICD-10-CM | POA: Diagnosis not present

## 2023-11-15 NOTE — Progress Notes (Signed)
 [facility]  Telephone:(336) [phone] Fax:(336) [phone]  ID: Madison [name] OB: 27-Nov-1980  MR#: [id number]  QIO#:[id number]  Patient Care Team: Jackolyn Confer, MD as PCP - General (Family Medicine) Benita Gutter, RN as Registered Nurse  I connected with Madison [name] on 11/15/23 at  2:30 PM EDT by video enabled telemedicine visit and verified that I am speaking with the correct person using two identifiers.   I discussed the limitations, risks, security and privacy concerns of performing an evaluation and management service by telemedicine and the availability of in-person appointments. I also discussed with the patient that there may be a patient responsible charge related to this service. The patient expressed understanding and agreed to proceed.   Other persons participating in the visit and their role in the encounter: Patient, MD.  Patient's location: Home. Provider's location: Clinic.  CHIEF COMPLAINT: Neutrophilia.  INTERVAL HISTORY: Patient agreed to video-assisted telemedicine visit for further evaluation and discussion of her laboratory results.  She continues to feel well and remains asymptomatic. She has no neurologic complaints.  She denies any recent fevers or illnesses.  She has a good appetite and denies weight loss.  She has no chest pain, shortness of breath, cough, or hemoptysis.  She denies any nausea, vomiting, constipation, or diarrhea.  She has no urinary complaints.  Patient offers no specific complaints today.  REVIEW OF SYSTEMS:   Review of Systems  Constitutional: Negative.  Negative for fever, malaise/fatigue and weight loss.  Respiratory: Negative.  Negative for cough, hemoptysis and shortness of breath.   Cardiovascular: Negative.  Negative for chest pain and leg swelling.  Gastrointestinal: Negative.  Negative for abdominal pain.  Genitourinary: Negative.  Negative for dysuria.  Musculoskeletal: Negative.  Negative for back  pain.  Skin: Negative.  Negative for rash.  Neurological: Negative.  Negative for dizziness, focal weakness, weakness and headaches.  Psychiatric/Behavioral: Negative.  The patient is not nervous/anxious.     As per HPI. Otherwise, a complete review of systems is negative.  PAST MEDICAL HISTORY: Past Medical History:  Diagnosis Date   Acute right eye pain 06/10/2016   Allergy 2012   Adhesive   Anxiety    Cancer (HCC) 2017   Cervical   Depression     PAST SURGICAL HISTORY: Past Surgical History:  Procedure Laterality Date   ABDOMINAL HYSTERECTOMY  2017   BREAST ENHANCEMENT SURGERY Bilateral    BREAST SURGERY     CERVICAL CONIZATION W/BX N/A 04/28/2016   Procedure: CONIZATION CERVIX WITH BIOPSY;  Surgeon: Conard Novak, MD;  Location: [facility] OR[facility];  Service: Gynecology;  Laterality: N/A;   CESAREAN SECTION     CESAREAN SECTION WITH BILATERAL TUBAL LIGATION Bilateral 12/08/2015   Procedure: CESAREAN SECTION WITH BILATERAL TUBAL LIGATION;  Surgeon: Conard Novak, MD;  Location: [facility];  Service: Obstetrics;  Laterality: Bilateral;   COSMETIC SURGERY  2005   Breast lift/liposuction   DILATION AND CURETTAGE OF UTERUS     ROBOTIC ASSISTED LAPAROSCOPIC HYSTERECTOMY AND SALPINGECTOMY  2017   TUBAL LIGATION  2017    FAMILY HISTORY: Family History  Problem Relation Age of Onset   Cervical cancer Mother    Diabetes Mellitus II Father    Heart disease Father    Lung cancer Maternal Grandmother    Ovarian cancer Paternal Grandmother     ADVANCED DIRECTIVES (Y/N):  N  HEALTH MAINTENANCE: Social History   Tobacco Use   Smoking status: Never    Passive exposure: Past  Smokeless tobacco: Never  Vaping Use   Vaping status: Never Used  Substance Use Topics   Alcohol use: No   Drug use: Never     Colonoscopy:  PAP:  Bone density:  Lipid panel:  Allergies  Allergen Reactions   Tape Other (See Comments)    Burns skin    Current Outpatient Medications   Medication Sig Dispense Refill   acetaminophen (TYLENOL) 325 MG tablet Take by mouth.     escitalopram (LEXAPRO) 10 MG tablet Take 1 tablet (10 mg total) by mouth daily. 30 tablet 2   hydrOXYzine (ATARAX) 10 MG tablet Take 1 tablet (10 mg total) by mouth 3 (three) times daily as needed. 270 tablet 1   No current facility-administered medications for this visit.    OBJECTIVE: There were no vitals filed for this visit.    There is no height or weight on file to calculate BMI.    ECOG FS:0 - Asymptomatic  General: Well-developed, well-nourished, no acute distress. HEENT: Normocephalic. Neuro: Alert, answering all questions appropriately. Cranial nerves grossly intact. Psych: Normal affect.   LAB RESULTS:  Lab Results  Component Value Date   NA 137 09/12/2023   K 4.3 09/12/2023   CL 99 09/12/2023   CO2 20 09/12/2023   GLUCOSE 91 09/12/2023   BUN 8 09/12/2023   CREATININE 0.74 09/12/2023   CALCIUM 9.5 09/12/2023   PROT 7.1 09/12/2023   ALBUMIN 4.4 09/12/2023   AST 31 09/12/2023   ALT 36 (H) 09/12/2023   ALKPHOS 84 09/12/2023   BILITOT 0.5 09/12/2023   GFRNONAA 104 03/04/2019   GFRAA 120 03/04/2019    Lab Results  Component Value Date   WBC 10.2 10/25/2023   NEUTROABS 5.2 10/25/2023   HGB 14.3 10/25/2023   HCT 40.1 10/25/2023   MCV 84.6 10/25/2023   PLT 388 10/25/2023     STUDIES: No results found.  ASSESSMENT: Neutrophilia.  PLAN:    Neutrophilia: Resolved.  Patient's total white blood cell count as well as her differential including neutrophils are within normal.  All of her other laboratory work including peripheral blood flow cytometry as well as BCR-ABL mutation is either negative or within normal limits. No intervention is needed.  Patient does not require bone marrow biopsy.  No further follow-up has been scheduled.  Please refer patient back if there are any questions or concerns.  I provided 20 minutes of face-to-face video visit time during this  encounter which included chart review, counseling, and coordination of care as documented above.   Patient expressed understanding and was in agreement with this plan. She also understands that She can call clinic at any time with any questions, concerns, or complaints.     Jeralyn Ruths, MD   11/15/2023 3:04 PM

## 2023-12-19 ENCOUNTER — Ambulatory Visit: Payer: 59 | Admitting: Pediatrics

## 2023-12-19 ENCOUNTER — Ambulatory Visit: Admitting: Pediatrics

## 2023-12-19 ENCOUNTER — Encounter: Payer: Self-pay | Admitting: Pediatrics

## 2023-12-19 VITALS — BP 135/88 | HR 68 | Temp 97.7°F | Wt 210.2 lb

## 2023-12-19 DIAGNOSIS — F411 Generalized anxiety disorder: Secondary | ICD-10-CM

## 2023-12-19 MED ORDER — ESCITALOPRAM OXALATE 20 MG PO TABS: 20.0000 mg | ORAL_TABLET | Freq: Every day | ORAL | 0 refills | Status: DC

## 2023-12-19 MED ORDER — BUSPIRONE HCL 5 MG PO TABS: 5.0000 mg | ORAL_TABLET | Freq: Two times a day (BID) | ORAL | 3 refills | Status: AC | PRN

## 2023-12-19 NOTE — Progress Notes (Signed)
 Office Visit  BP 135/88   Pulse 68   Temp 97.7 F (36.5 C) (Oral)   Wt 210 lb 3.2 oz (95.3 kg)   LMP 05/07/2016 (Exact Date)   SpO2 98%   BMI 36.33 kg/m    Subjective:    Patient ID: Madison [name], female    DOB: Jun 15, 1981, 43 y.o.   MRN: [id number]  HPI: Madison [name] is a 43 y.o. female  Chief Complaint  Patient presents with   Anxiety    Discussed the use of AI scribe software for clinical note transcription with the patient, who gave verbal consent to proceed.  History of Present Illness   Madison [name] is a 43 year old female who presents for follow-up on anxiety symptoms.  She is managing her anxiety symptoms with Lexapro , which she finds effective, although she experiences some breakthrough symptoms. She is considering an increase in dosage. She takes Lexapro  at night, which improves her sleep.  She also uses hydroxyzine  for anxiety but experiences dry mouth and sedation. To mitigate these effects, she takes half the dose. She is sensitive to medications, noting that even a Tylenol  PM can leave her groggy the next day.  Her sleep has improved significantly and is described as 'much better'.  She is currently managing work and family life, mentioning a busy schedule with her children's activities and a recent birthday celebration.        Relevant past medical, surgical, family and social history reviewed and updated as indicated. Interim medical history since our last visit reviewed. Allergies and medications reviewed and updated.  ROS per HPI unless specifically indicated above     Objective:    BP 135/88   Pulse 68   Temp 97.7 F (36.5 C) (Oral)   Wt 210 lb 3.2 oz (95.3 kg)   LMP 05/07/2016 (Exact Date)   SpO2 98%   BMI 36.33 kg/m   Wt Readings from Last 3 Encounters:  12/19/23 210 lb 3.2 oz (95.3 kg)  10/25/23 211 lb (95.7 kg)  10/18/23 210 lb (95.3 kg)     Physical Exam Constitutional:      Appearance: Normal appearance.  Pulmonary:      Effort: Pulmonary effort is normal.  Musculoskeletal:        General: Normal range of motion.  Skin:    Comments: Normal skin color  Neurological:     General: No focal deficit present.     Mental Status: She is alert. Mental status is at baseline.  Psychiatric:        Mood and Affect: Mood normal.        Behavior: Behavior normal.        Thought Content: Thought content normal.         12/19/2023    3:37 PM 10/25/2023    2:02 PM 10/18/2023    4:16 PM 09/12/2023   10:30 AM 04/08/2019    8:34 AM  Depression screen PHQ 2/9  Decreased Interest 0 0 0 0 0  Down, Depressed, Hopeless 0 0 0 0 0  PHQ - 2 Score 0 0 0 0 0  Altered sleeping 0  3 2 0  Tired, decreased energy 0  3 2 0  Change in appetite 0  3 3 0  Feeling bad or failure about yourself  0  0 0 0  Trouble concentrating 0  0 0 0  Moving slowly or fidgety/restless 0  0 0 0  Suicidal thoughts 0  0  0 0  PHQ-9 Score 0  9 7 0  Difficult doing work/chores Not difficult at all  Somewhat difficult Somewhat difficult Not difficult at all       12/19/2023    3:37 PM 10/18/2023    4:16 PM 09/12/2023   10:30 AM 05/13/2019   11:07 AM  GAD 7 : Generalized Anxiety Score  Nervous, Anxious, on Edge 0 0 3 0  Control/stop worrying 0 0 1 0  Worry too much - different things 0 0 1 0  Trouble relaxing 0 0 0 0  Restless 0 0 0 0  Easily annoyed or irritable 0 0 1 0  Afraid - awful might happen 0 0 0 0  Total GAD 7 Score 0 0 6 0  Anxiety Difficulty Not difficult at all Not difficult at all Somewhat difficult        Assessment & Plan:  Assessment & Plan   Generalized anxiety disorder Assessment & Plan: Anxiety managed with Lexapro , but breakthrough symptoms persist. Hydroxyzine  causes sedation. Plan to increase Lexapro  and switch to Buspar  to minimize rescue medication use. - Increase Lexapro  to 20 mg daily, 90-day supply. - Prescribe Buspar  5 mg twice daily as needed, increase to 10 mg if necessary. - Follow-up in 4 weeks to assess  Lexapro  effectiveness.   Orders: -     busPIRone  HCl; Take 1 tablet (5 mg total) by mouth 2 (two) times daily as needed (anxiety).  Dispense: 30 tablet; Refill: 3 -     Escitalopram  Oxalate; Take 1 tablet (20 mg total) by mouth daily.  Dispense: 90 tablet; Refill: 0      Follow up plan: Return in about 4 weeks (around 01/16/2024) for Mood, (virtual).  Hadassah Letters, MD

## 2023-12-19 NOTE — Patient Instructions (Addendum)
 Increase lexapro  to 20mg , change to buspar; can use 5mg  twice daily as needed.

## 2023-12-21 ENCOUNTER — Encounter: Payer: Self-pay | Admitting: Pediatrics

## 2023-12-21 NOTE — Assessment & Plan Note (Signed)
 Anxiety managed with Lexapro , but breakthrough symptoms persist. Hydroxyzine  causes sedation. Plan to increase Lexapro  and switch to Buspar  to minimize rescue medication use. - Increase Lexapro  to 20 mg daily, 90-day supply. - Prescribe Buspar  5 mg twice daily as needed, increase to 10 mg if necessary. - Follow-up in 4 weeks to assess Lexapro  effectiveness.

## 2024-01-16 ENCOUNTER — Telehealth (INDEPENDENT_AMBULATORY_CARE_PROVIDER_SITE_OTHER): Admitting: Pediatrics

## 2024-01-16 DIAGNOSIS — R7303 Prediabetes: Secondary | ICD-10-CM | POA: Diagnosis not present

## 2024-01-16 DIAGNOSIS — F411 Generalized anxiety disorder: Secondary | ICD-10-CM | POA: Diagnosis not present

## 2024-01-16 DIAGNOSIS — R7401 Elevation of levels of liver transaminase levels: Secondary | ICD-10-CM

## 2024-01-16 DIAGNOSIS — E785 Hyperlipidemia, unspecified: Secondary | ICD-10-CM | POA: Diagnosis not present

## 2024-01-16 MED ORDER — ESCITALOPRAM OXALATE 20 MG PO TABS: 20.0000 mg | ORAL_TABLET | Freq: Every day | ORAL | 3 refills | Status: AC

## 2024-01-16 NOTE — Progress Notes (Unsigned)
 Telehealth Visit  I connected with  [name] on 01/19/24 by a video enabled telemedicine application and verified that I am speaking with the correct person using two identifiers.   I discussed the limitations of evaluation and management by telemedicine. The patient expressed understanding and agreed to proceed.  Subjective:    Patient ID: Madison [name], female    DOB: 07-05-1981, 43 y.o.   MRN: [id number]  HPI: Madison [name] is a 43 y.o. female  Chief Complaint  Patient presents with   Depression    Doing well since medication has been increased.     Discussed the use of AI scribe software for clinical note transcription with the patient, who gave verbal consent to proceed.  History of Present Illness   Madison [name] is a 43 year old female who presents for medication management and follow-up.  She is currently on a stable dose of Lexapro , which effectively manages her symptoms without causing side effects such as headaches or sleep disturbances. She feels 'mellow' yet remains productive, indicating a positive impact on her daily life.  She has Buspar  available but has not noticed significant benefits from it and has not been using it regularly. She has several tablets left and does not feel the need for more at this time.  She recalls a previous discussion about blood work, specifically an A1c [name], which she plans to complete around July. She also mentions a slightly elevated liver enzyme noted in past tests, which will be re-evaluated.  No headaches or sleep disturbances from her current medication regimen.     Relevant past medical, surgical, family and social history reviewed and updated as indicated. Interim medical history since our last visit reviewed. Allergies and medications reviewed and updated.  ROS per HPI unless specifically indicated above     Objective:     LMP 05/07/2016 (Exact Date)   Wt Readings from Last 3 Encounters:  12/19/23 210 lb  3.2 oz (95.3 kg)  10/25/23 211 lb (95.7 kg)  10/18/23 210 lb (95.3 kg)     Physical Exam Constitutional:      Appearance: Normal appearance.  Pulmonary:     Effort: Pulmonary effort is normal.  Musculoskeletal:        General: Normal range of motion.  Skin:    Comments: Normal skin color  Neurological:     General: No focal deficit present.     Mental Status: She is alert. Mental status is at baseline.  Psychiatric:        Mood and Affect: Mood normal.        Behavior: Behavior normal.        Thought Content: Thought content normal.      LIMITED EXAM GIVEN VIDEO VISIT     Assessment & Plan:  Assessment & Plan   Generalized anxiety disorder Assessment & Plan: Anxiety well-managed with Lexapro . No side effects reported. Positive response to SSRIs. - Continue Lexapro  at current dose with 90-day supply and three refills. - Hold off on additional Buspar  unless needed.  Orders: -     Escitalopram  Oxalate; Take 1 tablet (20 mg total) by mouth daily.  Dispense: 90 tablet; Refill: 3  Elevated ALT measurement Assessment & Plan: Previous liver enzyme levels slightly elevated. Monitoring necessary. - Order liver function tests in July along with A1c and lipid panel.  Orders: -     Comprehensive metabolic panel with GFR; Future  Prediabetes -     Hemoglobin A1c; Future  Hyperlipidemia, unspecified hyperlipidemia type -     Lipid panel; Future     Follow up plan: Return in about 6 months (around 07/18/2024) for Mood.  Hadassah Letters, MD   This visit was completed via video visit through MyChart due to the restrictions of the COVID-19 pandemic. All issues as above were discussed and addressed. Physical exam was done as above through visual confirmation on video through MyChart. If it was felt that the patient should be evaluated in the office, they were directed there. The patient verbally consented to this visit."} Location of the patient: parking lot Location of the  provider: work Those involved with this call:  Provider: Geraldine Kling, MD CMA: Mancil Seat, CMA Time spent on call: 10 minutes with patient face to face via video conference. More than 50% of this time was spent in counseling and coordination of care. 20 minutes total spent in review of patient's record and preparation of their chart. Total time spent on this encounter: 30 minutes.

## 2024-01-19 ENCOUNTER — Encounter: Payer: Self-pay | Admitting: Pediatrics

## 2024-01-19 DIAGNOSIS — R7401 Elevation of levels of liver transaminase levels: Secondary | ICD-10-CM | POA: Insufficient documentation

## 2024-01-19 NOTE — Patient Instructions (Signed)
 I placed future lab order for repeat labs in July  Let me know if any issues come up in the meantime

## 2024-01-19 NOTE — Assessment & Plan Note (Signed)
 Previous liver enzyme levels slightly elevated. Monitoring necessary. - Order liver function tests in July along with A1c and lipid panel.

## 2024-01-19 NOTE — Assessment & Plan Note (Signed)
 Anxiety well-managed with Lexapro . No side effects reported. Positive response to SSRIs. - Continue Lexapro  at current dose with 90-day supply and three refills. - Hold off on additional Buspar  unless needed.

## 2024-02-09 ENCOUNTER — Other Ambulatory Visit: Payer: Self-pay | Admitting: Pediatrics

## 2024-02-09 DIAGNOSIS — Z1231 Encounter for screening mammogram for malignant neoplasm of breast: Secondary | ICD-10-CM

## 2024-02-12 ENCOUNTER — Ambulatory Visit
Admission: RE | Admit: 2024-02-12 | Discharge: 2024-02-12 | Discharge status: Home / Self Care | Source: Ambulatory Visit

## 2024-02-12 DIAGNOSIS — Z1231 Encounter for screening mammogram for malignant neoplasm of breast: Secondary | ICD-10-CM

## 2024-02-15 ENCOUNTER — Ambulatory Visit: Payer: Self-pay | Admitting: Pediatrics

## 2024-02-27 ENCOUNTER — Ambulatory Visit: Payer: Self-pay | Admitting: Podiatry

## 2024-02-27 ENCOUNTER — Ambulatory Visit (INDEPENDENT_AMBULATORY_CARE_PROVIDER_SITE_OTHER)

## 2024-02-27 VITALS — Ht 63.78 in | Wt 210.2 lb

## 2024-02-27 DIAGNOSIS — M7751 Other enthesopathy of right foot: Secondary | ICD-10-CM | POA: Diagnosis not present

## 2024-02-27 DIAGNOSIS — M19071 Primary osteoarthritis, right ankle and foot: Secondary | ICD-10-CM | POA: Diagnosis not present

## 2024-02-27 DIAGNOSIS — M205X1 Other deformities of toe(s) (acquired), right foot: Secondary | ICD-10-CM

## 2024-02-27 NOTE — Patient Instructions (Signed)
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
## 2024-03-04 ENCOUNTER — Encounter: Payer: Self-pay | Admitting: Podiatry

## 2024-03-06 ENCOUNTER — Encounter: Payer: Self-pay | Admitting: Podiatry

## 2024-03-09 ENCOUNTER — Ambulatory Visit
Admission: RE | Admit: 2024-03-09 | Discharge: 2024-03-09 | Disposition: A | Discharge status: Home / Self Care | Source: Ambulatory Visit | Attending: Podiatry

## 2024-03-09 DIAGNOSIS — M19071 Primary osteoarthritis, right ankle and foot: Secondary | ICD-10-CM

## 2024-03-18 ENCOUNTER — Other Ambulatory Visit

## 2024-03-18 ENCOUNTER — Ambulatory Visit: Payer: Self-pay | Admitting: Podiatry

## 2024-03-18 DIAGNOSIS — R7401 Elevation of levels of liver transaminase levels: Secondary | ICD-10-CM

## 2024-03-18 DIAGNOSIS — R7303 Prediabetes: Secondary | ICD-10-CM

## 2024-03-18 DIAGNOSIS — E785 Hyperlipidemia, unspecified: Secondary | ICD-10-CM

## 2024-03-19 ENCOUNTER — Ambulatory Visit: Payer: Self-pay | Admitting: Pediatrics

## 2024-03-19 LAB — COMPREHENSIVE METABOLIC PANEL WITH GFR
ALT: 35 IU/L — ABNORMAL HIGH (ref 0–32)
AST: 28 IU/L (ref 0–40)
Albumin: 3.9 g/dL (ref 3.9–4.9)
Alkaline Phosphatase: 80 IU/L (ref 44–121)
BUN/Creatinine Ratio: 9 (ref 9–23)
BUN: 7 mg/dL (ref 6–24)
Bilirubin Total: 0.4 mg/dL (ref 0.0–1.2)
CO2: 20 mmol/L (ref 20–29)
Calcium: 9.3 mg/dL (ref 8.7–10.2)
Chloride: 102 mmol/L (ref 96–106)
Creatinine, Ser: 0.75 mg/dL (ref 0.57–1.00)
Globulin, Total: 2.7 g/dL (ref 1.5–4.5)
Glucose: 100 mg/dL — ABNORMAL HIGH (ref 70–99)
Potassium: 4.1 mmol/L (ref 3.5–5.2)
Sodium: 137 mmol/L (ref 134–144)
Total Protein: 6.6 g/dL (ref 6.0–8.5)
eGFR: 102 mL/min/1.73 (ref >59)

## 2024-03-19 LAB — LIPID PANEL
Chol/HDL Ratio: 7.3 ratio — ABNORMAL HIGH (ref 0.0–4.4)
Cholesterol, Total: 240 mg/dL — ABNORMAL HIGH (ref 100–199)
HDL: 33 mg/dL — ABNORMAL LOW (ref >39)
LDL Chol Calc (NIH): 145 mg/dL — ABNORMAL HIGH (ref 0–99)
Triglycerides: 337 mg/dL — ABNORMAL HIGH (ref 0–149)
VLDL Cholesterol Cal: 62 mg/dL — ABNORMAL HIGH (ref 5–40)

## 2024-03-19 LAB — HEMOGLOBIN A1C
Est. average glucose Bld gHb Est-mCnc: 114 mg/dL
Hgb A1c MFr Bld: 5.6 % (ref 4.8–5.6)

## 2024-03-26 ENCOUNTER — Other Ambulatory Visit: Payer: Self-pay | Admitting: Pediatrics

## 2024-03-26 DIAGNOSIS — G4700 Insomnia, unspecified: Secondary | ICD-10-CM

## 2024-03-26 MED ORDER — TRAZODONE HCL 50 MG PO TABS: 25.0000 mg | ORAL_TABLET | Freq: Every evening | ORAL | 3 refills | Status: AC | PRN

## 2024-03-26 NOTE — Progress Notes (Signed)
 Requesting sleep aid, previously discussed trazodone . Sent to pharmacy. Needs appointment for other agents and re-evaluation if needed.  Madison SHAUNNA Nett, MD

## 2024-03-28 ENCOUNTER — Encounter: Payer: Self-pay | Admitting: Podiatry

## 2024-03-28 ENCOUNTER — Ambulatory Visit: Admitting: Podiatry

## 2024-03-28 DIAGNOSIS — E559 Vitamin D deficiency, unspecified: Secondary | ICD-10-CM

## 2024-03-28 DIAGNOSIS — M205X1 Other deformities of toe(s) (acquired), right foot: Secondary | ICD-10-CM

## 2024-03-28 DIAGNOSIS — M19071 Primary osteoarthritis, right ankle and foot: Secondary | ICD-10-CM | POA: Diagnosis not present

## 2024-03-28 NOTE — Progress Notes (Signed)
 Subjective:  Patient ID: Madison [name], female    DOB: 04-Feb-1981,  MRN: [id number]  Chief Complaint  Patient presents with   Arthritis    Right foot - MRI results    Discussed the use of AI scribe software for clinical note transcription with the patient, who gave verbal consent to proceed.  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  She experiences persistent pain in the right great toe joint, which remains unchanged since her last visit. The pain is localized over the joint, with a bone spur contributing to the discomfort. The arthritis in the joint significantly impacts her ability to perform certain activities.  She manages the pain with oral medications like ibuprofen . She mentions feeling tired and finds open-toed shoes intolerable. She has not been using any specific supplements for arthritis.  In terms of social history, she has a big trip to [place] planned in January and is concerned about the impact of her condition on her ability to participate in activities during the trip.       Objective:    Physical Exam VASCULAR: DP and PT pulse palpable. Foot is warm and well-perfused. Capillary fill time is brisk. DERMATOLOGIC: Normal skin turgor, texture, and temperature. No open lesions, rashes, or ulcerations. NEUROLOGIC: Normal sensation to light touch and pressure. No paresthesias. ORTHOPEDIC: Limited range of motion of the right first MTP joint with dorsal spurring and pain with midrange motion. No ecchymosis or bruising. No gross deformity. No pain to palpation of other joints.   No images are attached to the encounter.    Results RADIOLOGY Right foot MRI: Moderate osteoarthritis of the first MTP joint, mild edema of the distal first metatarsal with stress-related changes, osteoarthritis of the first metatarsal medial sesamoid articulation, subchondral  cyst formation with likely collapse into the subchondral head causing bony edema. (03/13/2022)   Assessment:  No diagnosis found.   Plan:  Patient was evaluated and treated and all questions answered.  Assessment and Plan Assessment & Plan Osteoarthritis of right first metatarsophalangeal joint with dorsal bone spur and subchondral cyst Presents with moderate osteoarthritis of the right first metatarsophalangeal (MTP) joint, characterized by a dorsal bone spur and subchondral cyst formation. MRI indicates mild edema of the distal metatarsal and stress-related changes, with cartilage erosion and cyst formation contributing to pain and limited range of motion. - Proceed with scheduling first MTP joint fusion surgery.  We discussed the alternative cheilectomy and the risks benefits and limitations of each of these procedures - Perform preoperative vitamin D level check and supplement if necessary. - Advise use of Motrin  for pain management in the interim.  If we need to delay surgery could consider corticosteroid injection as temporary measure.. - Recommend heat application in the morning and cold packs in the evening for symptom relief. - Discuss postoperative care including initial non-weight bearing period and use of a knee scooter, followed by gradual weight bearing in a walking boot.  Expect she will be nonweightbearing for approximately 2 to 3 weeks followed by weightbearing in the boot gradually may still need a knee scooter when she returns to work around 2 to 3 weeks    Surgical plan:  Procedure: - Right first MTP fusion  Location: - [facility]  Anesthesia plan: - General With regional block  Postoperative pain plan: - Tylenol  1000 mg every 6 hours, ibuprofen  600 mg every 8  hours, gabapentin 300 mg every 8 hours x5 days, oxycodone  5 mg 1-2 tabs every 6 hours only as needed  DVT prophylaxis: - Aspirin 325 mg twice daily  WB Restrictions / DME needs: - Nonweightbearing in boot  with knee scooter    No follow-ups on file.

## 2024-04-02 ENCOUNTER — Ambulatory Visit: Admitting: Podiatry

## 2024-04-02 ENCOUNTER — Ambulatory Visit: Payer: Self-pay | Admitting: Podiatry

## 2024-04-02 LAB — CALCIUM: Calcium: 8.9 mg/dL (ref 8.7–10.2)

## 2024-04-02 LAB — VITAMIN D 25 HYDROXY (VIT D DEFICIENCY, FRACTURES): Vit D, 25-Hydroxy: 26.7 ng/mL — ABNORMAL LOW (ref 30.0–100.0)

## 2024-04-02 MED ORDER — VITAMIN D (ERGOCALCIFEROL) 1.25 MG (50000 UNIT) PO CAPS: 50000.0000 [IU] | ORAL_CAPSULE | ORAL | 0 refills | Status: AC

## 2024-04-10 DIAGNOSIS — Z0279 Encounter for issue of other medical certificate: Secondary | ICD-10-CM

## 2024-04-15 NOTE — Telephone Encounter (Signed)
 recd forms from [facility] office. called and lft mess for call back to confirm email addr to send her copy for her records

## 2024-05-14 ENCOUNTER — Telehealth: Payer: Self-pay | Admitting: Podiatry

## 2024-05-14 NOTE — Telephone Encounter (Signed)
 DOS- 06/21/2024  GREAT TOE HALLUX MPJ FUSION RT- 71249  [employer] EFFECTIVE DATE- 08/23/2023  DEDUCTIBLE- $1500 REMAINING- $677.29 OOP- $5900 REMAINING- $4519.67 FAMILY DEDUCTIBLE- $4500 REMAINING- $3677.29 FAMILY OOP- $16300 REMAINING- $85304.87 COINSURANCE- 30%  PER AVAILTY PORTAL, NO PRIOR AUTH IS REQUIRED FOR CPT CODE 71249. DOCUMENTATION ATTACHED TO SURGICAL CONSENT PACKET.

## 2024-06-20 ENCOUNTER — Other Ambulatory Visit: Payer: Self-pay | Admitting: Podiatry

## 2024-06-20 MED ORDER — IBUPROFEN 600 MG PO TABS: 600.0000 mg | ORAL_TABLET | Freq: Four times a day (QID) | ORAL | 0 refills | Status: AC | PRN

## 2024-06-20 MED ORDER — ACETAMINOPHEN 500 MG PO TABS: 1000.0000 mg | ORAL_TABLET | Freq: Four times a day (QID) | ORAL | 0 refills | Status: AC | PRN

## 2024-06-20 MED ORDER — GABAPENTIN 300 MG PO CAPS: 300.0000 mg | ORAL_CAPSULE | Freq: Three times a day (TID) | ORAL | 0 refills | Status: DC

## 2024-06-20 MED ORDER — ASPIRIN 81 MG PO TBEC: 81.0000 mg | DELAYED_RELEASE_TABLET | Freq: Two times a day (BID) | ORAL | 0 refills | Status: AC

## 2024-06-20 MED ORDER — OXYCODONE HCL 5 MG PO TABS: 5.0000 mg | ORAL_TABLET | ORAL | 0 refills | Status: AC | PRN

## 2024-06-21 DIAGNOSIS — M150 Primary generalized (osteo)arthritis: Secondary | ICD-10-CM | POA: Diagnosis not present

## 2024-06-26 ENCOUNTER — Ambulatory Visit (INDEPENDENT_AMBULATORY_CARE_PROVIDER_SITE_OTHER): Admitting: Podiatry

## 2024-06-26 ENCOUNTER — Ambulatory Visit (INDEPENDENT_AMBULATORY_CARE_PROVIDER_SITE_OTHER)

## 2024-06-26 DIAGNOSIS — M205X1 Other deformities of toe(s) (acquired), right foot: Secondary | ICD-10-CM

## 2024-06-27 NOTE — Progress Notes (Signed)
  Subjective:  Patient ID: Madison [name], female    DOB: [birth year],  MRN: [id number]  Chief Complaint  Patient presents with   Post-op Follow-up    Rm 6 POV # 1 DOS 06/21/24 RT GREAT TOE JOINT FUSION. Surgical site healing well, all sutures are intact with moderate bruising and swelling.   43 y.o. female returns for post-op check.   Review of Systems: Negative except as noted in the HPI. Denies N/V/F/Ch.   Objective:  There were no vitals filed for this visit. There is no height or weight on file to calculate BMI. Constitutional Well developed. Well nourished.  Vascular Foot warm and well perfused. Capillary refill normal to all digits.  Calf is soft and supple, no posterior calf or knee pain, negative Homans' sign  Neurologic Normal speech. Oriented to person, place, and time. Epicritic sensation to light touch grossly present bilaterally.  Dermatologic Skin healing well without signs of infection. Skin edges well coapted without signs of infection.  Orthopedic: Tenderness to palpation noted about the surgical site.   Multiple view plain film radiographs: Good correction noted hardware intact Assessment:   1. Hallux limitus of right foot    Plan:  Patient was evaluated and treated and all questions answered.  S/p foot surgery right -Progressing as expected post-operatively. -XR: Noted above -WB Status: Nonweightbearing in CAM boot with scooter -Sutures: Return in 2 weeks to remove. -Medications: No refills required -Foot redressed.  No follow-ups on file.

## 2024-07-01 ENCOUNTER — Telehealth: Payer: Self-pay | Admitting: Podiatry

## 2024-07-01 NOTE — Telephone Encounter (Signed)
 lft mess on vmail for pt about forms she left with Dr. Silva. I need RTW date for her  I will not charge $25 since just RTW form.

## 2024-07-02 ENCOUNTER — Telehealth: Admitting: Pediatrics

## 2024-07-02 DIAGNOSIS — E785 Hyperlipidemia, unspecified: Secondary | ICD-10-CM | POA: Diagnosis not present

## 2024-07-02 DIAGNOSIS — F411 Generalized anxiety disorder: Secondary | ICD-10-CM

## 2024-07-02 DIAGNOSIS — Z6835 Body mass index (BMI) 35.0-35.9, adult: Secondary | ICD-10-CM

## 2024-07-02 MED ORDER — BUPROPION HCL ER (SR) 100 MG PO TB12: ORAL_TABLET | ORAL | 0 refills | Status: DC

## 2024-07-02 NOTE — Progress Notes (Signed)
 Telehealth Visit  I connected with  Madison [name] on 07/09/24 by a video enabled telemedicine application and verified that I am speaking with the correct person using two identifiers.   I discussed the limitations of evaluation and management by telemedicine. The patient expressed understanding and agreed to proceed.  Subjective:    Patient ID: Madison [name], female    DOB: 1981-07-10, 43 y.o.   MRN: [id number]  HPI: Madison [name] is a 43 y.o. female  Chief Complaint  Patient presents with   Anxiety    6 month follow-up    Discussed the use of AI scribe software for clinical note transcription with the patient, who gave verbal consent to proceed.  History of Present Illness   Madison [name] is a 43 year old female who presents with concerns about weight gain and medication management. She is accompanied by Madison [name], who assists with scheduling.  Lexapro  is effective, and she has not required Buspar  in the past month or two. She occasionally uses trazodone  for sleep, taking either a quarter or half of a tablet as needed, which she finds helpful.  She is concerned about a weight gain of 15-20 pounds since her last visit. She experiences sugar and carbohydrate cravings despite efforts to improve her diet. Five years ago, she was 50 pounds lighter, attributing this to increased physical activity and healthier eating habits.  She is also worried about her cholesterol levels and is attempting to improve her diet. She is uncertain about her current glucose levels and is anxious about her upcoming blood work results.      Relevant past medical, surgical, family and social history reviewed and updated as indicated. Interim medical history since our last visit reviewed. Allergies and medications reviewed and updated.  ROS per HPI unless specifically indicated above     Objective:    LMP 05/07/2016 (Exact Date)   Wt Readings from Last 3 Encounters:  02/27/24 210 lb 3.2 oz (95.3  kg)  12/19/23 210 lb 3.2 oz (95.3 kg)  10/25/23 211 lb (95.7 kg)     Physical Exam Constitutional:      General: She is not in acute distress.    Appearance: Normal appearance.  Neurological:     General: No focal deficit present.     Mental Status: She is alert. Mental status is at baseline.      LIMITED EXAM GIVEN VIDEO VISIT     Assessment & Plan:  Assessment & Plan   BMI 35.0-35.9,adult Assessment & Plan: Weight gain of 15-20 pounds. Discussed metabolic set point and weight management options. Chose Wellbutrin for appetite suppression. - Prescribed Wellbutrin 100mg  once daily, increase to twice daily if tolerated. - Provided information on injectable GLP-1 medications. - Scheduled follow-up in 3-4 weeks to assess response.  Orders: -     buPROPion HCl ER (SR); Take 1 tablet (100 mg total) by mouth 2 (two) times daily for 14 days, THEN 2 tablets (200 mg total) 2 (two) times daily for 14 days.  Dispense: 84 tablet; Refill: 0  Generalized anxiety disorder Assessment & Plan: Well-controlled with Lexapro . Buspar  not needed recently, indicating effective management. - Continue Lexapro  as prescribed. - Discontinued Buspar , retain as backup.   Hyperlipidemia, unspecified hyperlipidemia type Assessment & Plan: Requires updated blood work to assess cholesterol levels. - Ordered blood work for cholesterol levels the week of December 2nd. - Scheduled follow-up on December 11th to review results.       Follow up plan:  4 weeks  Hadassah SHAUNNA Nett, MD   This visit was completed via video visit through MyChart due to the restrictions of the COVID-19 pandemic. All issues as above were discussed and addressed. Physical exam was done as above through visual confirmation on video through MyChart. If it was felt that the patient should be evaluated in the office, they were directed there. The patient verbally consented to this visit.  Location of the patient: home Location of the  provider: work Those involved with this call:  Provider: Hadassah Nett, MD CMA: Clotilda Moles, CMA Time spent on call: 15 minutes with patient face to face via video conference. More than 50% of this time was spent in counseling and coordination of care. 15 minutes total spent in review of patient's record and preparation of their chart. Total time spent on this encounter: 30 minutes.

## 2024-07-02 NOTE — Patient Instructions (Signed)
 Start with just 100mg  daily then after 2 weeks ok if you want to take 200mg  (2 tabs). If you prefer to stay on just 100mg  that's fine.  Weight medications: Injectable medications: GLP1 agonist like ozempic, wegovy, zepbound, mounjaro  Once weekly injections Most common side effects: nausea, diarrhea, abdominal pain, constipation, acid reflux Higher risk for pancreatitis (inflammation of pancreas) - if developed this while on the medication would stop it Contraindications: history of medullary thyroid cancer, MEN2 disorders, pregnancy Depending on which injection, can see between 14-26% body weight loss Oral medications - can do single pill or combined as below Welbutrin/naltrexone SE: headaches, insomnia, increased anxiety 8-10% weight loss Phentermine/topiramate SE: headaches, insomnia, increased anxiety 8-10% weight loss With topiramate: nausea is most common Orlistat SE: greasy stools, abdominal pain 2% weight loss   GLP1 options outside insurance: Tzhncb self pay $499/mo through [employer]) - image below, shipped to you or picked up locally Self pay zepbound (tirzepatide) for them it's $329/mo 1st month then $499/mo thereafter - shipped to you There's compounding pharmacy programs like [facility] where you meet with a provider and they can prescribe compounded semaglutide or tirzepatide that's shipped to you. I have a couple patients who use that service and they pay - attached image from their website.     Compounded company:

## 2024-07-03 ENCOUNTER — Telehealth: Payer: Self-pay | Admitting: Podiatry

## 2024-07-03 NOTE — Telephone Encounter (Signed)
 cld pt bck from mess she lft. RTW 07/16/24 and accomm/restri until 08/09/24. She will need to wear cam boot/scooter, take breaks every 3 hrs (10-32mins) to elevate/ice if needed. No lifting and must limit walking. faxed to [facility] and emailed her

## 2024-07-09 ENCOUNTER — Encounter: Payer: Self-pay | Admitting: Pediatrics

## 2024-07-09 DIAGNOSIS — E785 Hyperlipidemia, unspecified: Secondary | ICD-10-CM | POA: Insufficient documentation

## 2024-07-09 DIAGNOSIS — Z6835 Body mass index (BMI) 35.0-35.9, adult: Secondary | ICD-10-CM | POA: Insufficient documentation

## 2024-07-09 NOTE — Assessment & Plan Note (Signed)
 Requires updated blood work to assess cholesterol levels. - Ordered blood work for cholesterol levels the week of December 2nd. - Scheduled follow-up on December 11th to review results.

## 2024-07-09 NOTE — Assessment & Plan Note (Signed)
 Weight gain of 15-20 pounds. Discussed metabolic set point and weight management options. Chose Wellbutrin for appetite suppression. - Prescribed Wellbutrin 100mg  once daily, increase to twice daily if tolerated. - Provided information on injectable GLP-1 medications. - Scheduled follow-up in 3-4 weeks to assess response.

## 2024-07-09 NOTE — Assessment & Plan Note (Signed)
 Well-controlled with Lexapro . Buspar  not needed recently, indicating effective management. - Continue Lexapro  as prescribed. - Discontinued Buspar , retain as backup.

## 2024-07-10 ENCOUNTER — Encounter: Admitting: Podiatry

## 2024-07-11 ENCOUNTER — Ambulatory Visit (INDEPENDENT_AMBULATORY_CARE_PROVIDER_SITE_OTHER): Admitting: Podiatry

## 2024-07-11 ENCOUNTER — Encounter: Payer: Self-pay | Admitting: Podiatry

## 2024-07-11 DIAGNOSIS — M205X1 Other deformities of toe(s) (acquired), right foot: Secondary | ICD-10-CM

## 2024-07-11 NOTE — Progress Notes (Signed)
  Subjective:  Patient ID: Madison [name], female    DOB: Feb 14, 1981,  MRN: [id number]  No chief complaint on file.  43 y.o. female returns for post-op check.   Review of Systems: Negative except as noted in the HPI. Denies N/V/F/Ch.   Objective:  There were no vitals filed for this visit. There is no height or weight on file to calculate BMI. Constitutional Well developed. Well nourished.  Vascular Foot warm and well perfused. Capillary refill normal to all digits.  Calf is soft and supple, no posterior calf or knee pain, negative Homans' sign  Neurologic Normal speech. Oriented to person, place, and time. Epicritic sensation to light touch grossly present bilaterally.  Dermatologic Skin healing well without signs of infection. Skin edges well coapted without signs of infection.  Orthopedic: Mild edema and pain to palpation noted about the surgical site.   Multiple view plain film radiographs: Good correction noted hardware intact Assessment:   1. Hallux limitus of right foot     Plan:  Patient was evaluated and treated and all questions answered.  S/p foot surgery right - Sutures removed uneventfully.  May begin weightbearing as tolerated in cam walker boot gradually.  Return in 3 weeks for new radiographs and transition to surgical shoe.  Should be able to start driving after this.  Follow-up sooner if issues.  Continue ice and elevation as needed.  No follow-ups on file.

## 2024-07-26 ENCOUNTER — Other Ambulatory Visit: Payer: Self-pay | Admitting: Pediatrics

## 2024-07-26 DIAGNOSIS — R7303 Prediabetes: Secondary | ICD-10-CM

## 2024-07-26 NOTE — Progress Notes (Signed)
 Future lab orders placed.   Hadassah Letters, MD

## 2024-07-26 NOTE — Telephone Encounter (Signed)
 Scheduled

## 2024-07-29 ENCOUNTER — Other Ambulatory Visit

## 2024-07-29 DIAGNOSIS — R7303 Prediabetes: Secondary | ICD-10-CM

## 2024-07-30 LAB — LIPID PANEL
Chol/HDL Ratio: 7.6 ratio — ABNORMAL HIGH (ref 0.0–4.4)
Cholesterol, Total: 257 mg/dL — ABNORMAL HIGH (ref 100–199)
HDL: 34 mg/dL — ABNORMAL LOW (ref >39)
LDL Chol Calc (NIH): 171 mg/dL — ABNORMAL HIGH (ref 0–99)
Triglycerides: 274 mg/dL — ABNORMAL HIGH (ref 0–149)
VLDL Cholesterol Cal: 52 mg/dL — ABNORMAL HIGH (ref 5–40)

## 2024-07-30 LAB — COMPREHENSIVE METABOLIC PANEL WITH GFR
ALT: 52 IU/L — ABNORMAL HIGH (ref 0–32)
AST: 39 IU/L (ref 0–40)
Albumin: 4.2 g/dL (ref 3.9–4.9)
Alkaline Phosphatase: 84 IU/L (ref 41–116)
BUN/Creatinine Ratio: 11 (ref 9–23)
BUN: 10 mg/dL (ref 6–24)
Bilirubin Total: 0.3 mg/dL (ref 0.0–1.2)
CO2: 22 mmol/L (ref 20–29)
Calcium: 9.3 mg/dL (ref 8.7–10.2)
Chloride: 102 mmol/L (ref 96–106)
Creatinine, Ser: 0.87 mg/dL (ref 0.57–1.00)
Globulin, Total: 2.6 g/dL (ref 1.5–4.5)
Glucose: 111 mg/dL — ABNORMAL HIGH (ref 70–99)
Potassium: 4.4 mmol/L (ref 3.5–5.2)
Sodium: 137 mmol/L (ref 134–144)
Total Protein: 6.8 g/dL (ref 6.0–8.5)
eGFR: 85 mL/min/1.73 (ref >59)

## 2024-07-30 LAB — HEMOGLOBIN A1C
Est. average glucose Bld gHb Est-mCnc: 126 mg/dL
Hgb A1c MFr Bld: 6 % — ABNORMAL HIGH (ref 4.8–5.6)

## 2024-07-31 ENCOUNTER — Ambulatory Visit (INDEPENDENT_AMBULATORY_CARE_PROVIDER_SITE_OTHER)

## 2024-07-31 ENCOUNTER — Ambulatory Visit: Admitting: Podiatry

## 2024-07-31 VITALS — Ht 63.78 in | Wt 210.2 lb

## 2024-07-31 DIAGNOSIS — M205X1 Other deformities of toe(s) (acquired), right foot: Secondary | ICD-10-CM

## 2024-08-01 ENCOUNTER — Encounter: Payer: Self-pay | Admitting: Pediatrics

## 2024-08-01 ENCOUNTER — Ambulatory Visit: Payer: Self-pay | Admitting: Pediatrics

## 2024-08-01 ENCOUNTER — Other Ambulatory Visit

## 2024-08-01 ENCOUNTER — Ambulatory Visit: Admitting: Pediatrics

## 2024-08-01 VITALS — BP 136/86 | HR 78 | Temp 98.1°F | Ht 63.78 in | Wt 223.4 lb

## 2024-08-01 DIAGNOSIS — R7401 Elevation of levels of liver transaminase levels: Secondary | ICD-10-CM

## 2024-08-01 DIAGNOSIS — R7303 Prediabetes: Secondary | ICD-10-CM

## 2024-08-01 MED ORDER — METFORMIN HCL 500 MG PO TABS: 500.0000 mg | ORAL_TABLET | Freq: Every day | ORAL | 3 refills | Status: AC

## 2024-08-01 NOTE — Progress Notes (Signed)
 Office Visit  BP 136/86 (BP Location: Left Arm, Patient Position: Sitting, Cuff Size: Large)   Pulse 78   Temp 98.1 F (36.7 C) (Oral)   Ht 5' 3.78 (1.62 m)   Wt 223 lb 6.4 oz (101.3 kg)   LMP 05/07/2016   SpO2 98%   BMI 38.61 kg/m    Subjective:    Patient ID: Madison [name], female    DOB: 26-Apr-1981, 43 y.o.   MRN: [id number]  HPI: Madison [name] is a 43 y.o. female  Chief Complaint  Patient presents with   Anxiety   Depression   Weight Management Screening    Discussed the use of AI scribe software for clinical note transcription with the patient, who gave verbal consent to proceed.  History of Present Illness   Madison [name] is a 43 year old female who presents with adverse reactions to Wellbutrin .  She began experiencing severe, excruciating headaches after starting Wellbutrin , which did not resolve with medication. Insomnia persisted despite taking a full dose of trazodone . These symptoms led her to discontinue Wellbutrin , after which she reports no further issues.  Wellbutrin  was initially prescribed for appetite suppression, which she found effective, but the side effects were intolerable. Since discontinuing the medication, she has not experienced any further headaches or insomnia.  She is currently taking Lexapro  and uses BuSpar  occasionally for mental health support, noting that BuSpar  is used very rarely but finds it reassuring to have it available.  She reports a recent episode of a severe headache and elevated blood pressure, which she associates with the use of Wellbutrin . She describes herself as sensitive to medications, noting that even a low dose of trazodone  affects her significantly.  She is concerned about her cholesterol levels, mentioning a sedentary lifestyle and dietary habits as contributing factors. She is not aware of a family history of high cholesterol. Her recent lab results showed higher cholesterol and an A1c in the prediabetic range,  as discussed with her clinician.  She is concerned about her liver health after seeing something about fatty liver in her lab results and attributes this concern to her lifestyle.     Relevant past medical, surgical, family and social history reviewed and updated as indicated. Interim medical history since our last visit reviewed. Allergies and medications reviewed and updated.  ROS per HPI unless specifically indicated above     Objective:    BP 136/86 (BP Location: Left Arm, Patient Position: Sitting, Cuff Size: Large)   Pulse 78   Temp 98.1 F (36.7 C) (Oral)   Ht 5' 3.78 (1.62 m)   Wt 223 lb 6.4 oz (101.3 kg)   LMP 05/07/2016   SpO2 98%   BMI 38.61 kg/m   Wt Readings from Last 3 Encounters:  08/01/24 223 lb 6.4 oz (101.3 kg)  07/31/24 210 lb 3.2 oz (95.3 kg)  02/27/24 210 lb 3.2 oz (95.3 kg)     Physical Exam Constitutional:      Appearance: Normal appearance.  Pulmonary:     Effort: Pulmonary effort is normal.  Musculoskeletal:        General: Normal range of motion.  Skin:    Comments: Normal skin color  Neurological:     General: No focal deficit present.     Mental Status: She is alert. Mental status is at baseline.  Psychiatric:        Mood and Affect: Mood normal.        Behavior: Behavior normal.  Thought Content: Thought content normal.         08/01/2024    4:29 PM 07/02/2024    3:48 PM 01/16/2024    4:22 PM 12/19/2023    3:37 PM 10/25/2023    2:02 PM  Depression screen PHQ 2/9  Decreased Interest 0 0 0 0 0  Down, Depressed, Hopeless 0 0 0 0 0  PHQ - 2 Score 0 0 0 0 0  Altered sleeping 0  0 0   Tired, decreased energy 0  0 0   Change in appetite 3  0 0   Feeling bad or failure about yourself  0  0 0   Trouble concentrating 0  0 0   Moving slowly or fidgety/restless 0  0 0   Suicidal thoughts 0  0 0   PHQ-9 Score 3  0  0    Difficult doing work/chores Not difficult at all   Not difficult at all      Data saved with a previous  flowsheet row definition       08/01/2024    4:30 PM 07/02/2024    3:48 PM 01/16/2024    4:22 PM 12/19/2023    3:37 PM  GAD 7 : Generalized Anxiety Score  Nervous, Anxious, on Edge 0 0 0 0  Control/stop worrying 0 0 0 0  Worry too much - different things 0 0 0 0  Trouble relaxing 0 0 0 0  Restless 0 0 0 0  Easily annoyed or irritable 0 0 0 0  Afraid - awful might happen 0 0 0 0  Total GAD 7 Score 0 0 0 0  Anxiety Difficulty Not difficult at all   Not difficult at all       Assessment & Plan:  Assessment & Plan   Elevated ALT measurement BMI 35.0-35.9,adult Assessment & Plan: Persistant elevation c/f NAFLD vs NASH. Management discussed with emphasis on lifestyle changes and pharmacotherapy. GLP-1 receptor agonists considered for appetite suppression and metabolic support. Discussed potential side effects and strategies to mitigate them. Insurance coverage challenges addressed, with potential for coverage if fatty liver disease is confirmed. Metformin  considered as an interim measure. - Ordered fasting blood work for hepatic function panel and ELF reflex test. - Scheduled liver ultrasound to assess for fatty liver disease. - Prescribed metformin  once daily. - Discussed GLP-1 receptor agonists for potential insurance coverage. - Consider self-pay options for GLP-1 receptor agonists if insurance does not cover. - Discussed telehealth options for compounded GLP-1 receptor agonists. - Scheduled follow-up with nurse practitioner Darice in three months.  Orders: -     Hepatic function panel; Future -     FIB-4 W/REFLEX TO ELF; Future -     US  ABDOMEN LIMITED RUQ (LIVER/GB); Future  Prediabetes Assessment & Plan: A1c in the prediabetes range, consistent with previous results. Discussed lifestyle modifications and potential pharmacotherapy to prevent progression to diabetes. Metformin  considered as an adjunct to lifestyle changes. - Prescribed metformin  once daily. - Encouraged  lifestyle modifications for weight management and blood sugar control.  Orders: -     metFORMIN  HCl; Take 1 tablet (500 mg total) by mouth daily with breakfast.  Dispense: 90 tablet; Refill: 3  Hyperlipidemia, unspecified hyperlipidemia type Assessment & Plan: Cholesterol levels elevated, possibly with a genetic component. Current risk score does not warrant immediate statin therapy. Discussed potential for future intervention if weight loss does not improve lipid profile or if liver ultrasound indicates fatty liver disease. - Continue to monitor  lipid levels and reassess need for statin therapy based on future lab results and liver ultrasound findings.   Follow up plan: Return for wt management.  Hadassah SHAUNNA Nett, MD

## 2024-08-01 NOTE — Patient Instructions (Addendum)
 Tzhncb self pay $499/mo through [employer] (manufacturer) - image below, shipped to you or picked up locally Self pay zepbound (tirzepatide) for them it's $329/mo 1st month then $499/mo thereafter - shipped to you There's compounding pharmacy programs like [facility] where you meet with a provider and they can prescribe compounded semaglutide or tirzepatide that's shipped to you. I have a couple patients who use that service and they pay - attached image from their website.     Compounded company:

## 2024-08-02 ENCOUNTER — Other Ambulatory Visit

## 2024-08-02 DIAGNOSIS — R7401 Elevation of levels of liver transaminase levels: Secondary | ICD-10-CM

## 2024-08-03 LAB — FIB-4 W/REFLEX TO ELF
FIB-4 Index: 0.6 (ref 0.00–2.67)
Platelets: 419 x10E3/uL (ref 150–450)

## 2024-08-03 LAB — HEPATIC FUNCTION PANEL
ALT: 62 IU/L — ABNORMAL HIGH (ref 0–32)
AST: 47 IU/L — ABNORMAL HIGH (ref 0–40)
Albumin: 4.2 g/dL (ref 3.9–4.9)
Alkaline Phosphatase: 83 IU/L (ref 41–116)
Bilirubin Total: 0.4 mg/dL (ref 0.0–1.2)
Bilirubin, Direct: 0.11 mg/dL (ref 0.00–0.40)
Total Protein: 6.8 g/dL (ref 6.0–8.5)

## 2024-08-04 NOTE — Progress Notes (Signed)
°  Subjective:  Patient ID: Madison [name], female    DOB: 1981-08-05,  MRN: [id number]  Chief Complaint  Patient presents with   Post-op Follow-up    Rm 8 POV # 3 DOS 06/21/24 RT GREAT TOE JOINT FUSION. Pt states minimum pain, is ready to transition to a different support shoe.   43 y.o. female returns for post-op check.   Review of Systems: Negative except as noted in the HPI. Denies N/V/F/Ch.   Objective:  There were no vitals filed for this visit. Body mass index is 36.33 kg/m. Constitutional Well developed. Well nourished.  Vascular Foot warm and well perfused. Capillary refill normal to all digits.  Calf is soft and supple, no posterior calf or knee pain, negative Homans' sign  Neurologic Normal speech. Oriented to person, place, and time. Epicritic sensation to light touch grossly present bilaterally.  Dermatologic Incision well healed and non hypertrophic  Orthopedic: minimal edema and no pain to palpation noted about the surgical site.   Multiple view plain film radiographs: Good correction noted hardware intact there is good consolidation across the fusion site but not complete, no loosening of implants Assessment:   1. Hallux limitus of right foot     Plan:  Patient was evaluated and treated and all questions answered.  S/p foot surgery right - Sutures removed uneventfully.  May begin weightbearing as tolerated in surgical shoe dispensed today, can also start gradual transition to regular shoes over next few weeks. Return 6 weeks for new xrays  No follow-ups on file.

## 2024-08-06 ENCOUNTER — Ambulatory Visit: Payer: Self-pay | Admitting: Pediatrics

## 2024-08-06 DIAGNOSIS — K760 Fatty (change of) liver, not elsewhere classified: Secondary | ICD-10-CM

## 2024-08-07 ENCOUNTER — Encounter: Payer: Self-pay | Admitting: Pediatrics

## 2024-08-07 DIAGNOSIS — R7303 Prediabetes: Secondary | ICD-10-CM | POA: Insufficient documentation

## 2024-08-07 NOTE — Assessment & Plan Note (Signed)
 Cholesterol levels elevated, possibly with a genetic component. Current risk score does not warrant immediate statin therapy. Discussed potential for future intervention if weight loss does not improve lipid profile or if liver ultrasound indicates fatty liver disease. - Continue to monitor lipid levels and reassess need for statin therapy based on future lab results and liver ultrasound findings.

## 2024-08-07 NOTE — Assessment & Plan Note (Addendum)
 Persistant elevation c/f NAFLD vs NASH. Management discussed with emphasis on lifestyle changes and pharmacotherapy. GLP-1 receptor agonists considered for appetite suppression and metabolic support. Discussed potential side effects and strategies to mitigate them. Insurance coverage challenges addressed, with potential for coverage if fatty liver disease is confirmed. Metformin  considered as an interim measure. - Ordered fasting blood work for hepatic function panel and ELF reflex test. - Scheduled liver ultrasound to assess for fatty liver disease. - Prescribed metformin  once daily. - Discussed GLP-1 receptor agonists for potential insurance coverage. - Consider self-pay options for GLP-1 receptor agonists if insurance does not cover. - Discussed telehealth options for compounded GLP-1 receptor agonists. - Scheduled follow-up with nurse practitioner Darice in three months.

## 2024-08-07 NOTE — Assessment & Plan Note (Signed)
 A1c in the prediabetes range, consistent with previous results. Discussed lifestyle modifications and potential pharmacotherapy to prevent progression to diabetes. Metformin  considered as an adjunct to lifestyle changes. - Prescribed metformin  once daily. - Encouraged lifestyle modifications for weight management and blood sugar control.

## 2024-08-12 ENCOUNTER — Ambulatory Visit
Admission: RE | Admit: 2024-08-12 | Discharge: 2024-08-12 | Disposition: A | Discharge status: Home / Self Care | Source: Ambulatory Visit | Attending: Pediatrics | Admitting: Pediatrics

## 2024-08-12 DIAGNOSIS — R7401 Elevation of levels of liver transaminase levels: Secondary | ICD-10-CM | POA: Insufficient documentation

## 2024-08-20 ENCOUNTER — Other Ambulatory Visit (HOSPITAL_COMMUNITY): Payer: Self-pay

## 2024-08-20 MED ORDER — WEGOVY 0.25 MG/0.5ML ~~LOC~~ SOAJ: 0.2500 mg | SUBCUTANEOUS | 0 refills | Status: AC

## 2024-08-21 ENCOUNTER — Other Ambulatory Visit (HOSPITAL_COMMUNITY): Payer: Self-pay

## 2024-08-21 ENCOUNTER — Telehealth: Payer: Self-pay

## 2024-08-21 NOTE — Telephone Encounter (Signed)
 Pharmacy Patient Advocate Encounter   Received notification from Physician's Office that prior authorization for [employer] [employer] inject[employer] is required/requested.   Insurance verification completed.   The patient is insured through [facility].   Per test claim: The current 28 day co-pay is, $1273.25.  No PA needed at this time. This test claim was processed through [facility]- copay amounts may vary at other pharmacies due to pharmacy/plan contracts, or as the patient moves through the different stages of their insurance plan.

## 2024-08-26 ENCOUNTER — Ambulatory Visit (INDEPENDENT_AMBULATORY_CARE_PROVIDER_SITE_OTHER)

## 2024-08-26 ENCOUNTER — Ambulatory Visit: Admitting: Podiatry

## 2024-08-26 VITALS — Ht 63.78 in | Wt 223.0 lb

## 2024-08-26 DIAGNOSIS — M205X1 Other deformities of toe(s) (acquired), right foot: Secondary | ICD-10-CM

## 2024-08-26 DIAGNOSIS — M84374A Stress fracture, right foot, initial encounter for fracture: Secondary | ICD-10-CM

## 2024-08-28 NOTE — Progress Notes (Signed)
"  °  Subjective:  Patient ID: Madison [name], female    DOB: 1981/03/09,  MRN: [id number]  Chief Complaint  Patient presents with   Foot Pain    RM 7 Patient is here for foot pain after prolonged walking/standing. Pt states pain on dorsal aspect (center of foot) of the right foot.   44 y.o. female returns for post-op check.  She has a new and different pain  Review of Systems: Negative except as noted in the HPI. Denies N/V/F/Ch.   Objective:  There were no vitals filed for this visit. Body mass index is 38.54 kg/m. Constitutional Well developed. Well nourished.  Vascular Foot warm and well perfused. Capillary refill normal to all digits.  Calf is soft and supple, no posterior calf or knee pain, negative Homans' sign  Neurologic Normal speech. Oriented to person, place, and time. Epicritic sensation to light touch grossly present bilaterally.  Dermatologic Incision well healed and non hypertrophic  Orthopedic: Tissues mild edema over the dorsal forefoot, no pain over the first MTP joint she has sharp pain to palpation of the metatarsal neck of the second metatarsal   Multiple view plain film radiographs: Good correction noted hardware intact there is good consolidation across the fusion site but not complete, no loosening of implants, there is new periosteal reaction of the metatarsal neck compared to preoperative films Assessment:   1. Hallux limitus of right foot   2. Stress reaction of right foot, initial encounter     Plan:  Patient was evaluated and treated and all questions answered.  S/p foot surgery right Unfortunately with changes in mechanics with foot seems to be developing a second metatarsal stress reaction has not fractured yet I did recommend going back to the walking boot for another 1 to 2 weeks, follow-up again in 1 month for new x-rays if still painful.  No follow-ups on file.  "

## 2024-09-11 ENCOUNTER — Encounter: Admitting: Podiatry

## 2024-09-25 ENCOUNTER — Ambulatory Visit: Admitting: Podiatry

## 2024-09-25 ENCOUNTER — Encounter: Admitting: Podiatry

## 2024-09-25 ENCOUNTER — Ambulatory Visit

## 2024-09-25 VITALS — Ht 63.78 in | Wt 233.0 lb

## 2024-09-25 DIAGNOSIS — M205X1 Other deformities of toe(s) (acquired), right foot: Secondary | ICD-10-CM

## 2024-09-25 DIAGNOSIS — M84374D Stress fracture, right foot, subsequent encounter for fracture with routine healing: Secondary | ICD-10-CM

## 2024-09-26 NOTE — Progress Notes (Signed)
"  °  Subjective:  Patient ID: Madison [name], female    DOB: 17-Jul-1981,  MRN: [id number]  Chief Complaint  Patient presents with   Post-op Follow-up    Rm 1 Patient is here for post-op f/u of the right foot. Pt states no pain or discomfort in the right foot.   44 y.o. female returns for post-op check.  She returns follow-up she is doing much better, She wore the boot for about a week.  Review of Systems: Negative except as noted in the HPI. Denies N/V/F/Ch.   Objective:  There were no vitals filed for this visit. Body mass index is 40.27 kg/m. Constitutional Well developed. Well nourished.  Vascular Foot warm and well perfused. Capillary refill normal to all digits.  Calf is soft and supple, no posterior calf or knee pain, negative Homans' sign  Neurologic Normal speech. Oriented to person, place, and time. Epicritic sensation to light touch grossly present bilaterally.  Dermatologic Incision well healed and non hypertrophic  Orthopedic: No edema.  No pain to palpation.   Multiple view plain film radiographs: Good correction noted hardware intact there is good consolidation across the fusion site that is now complete, there is thickening of the medial periosteum and cortex of the second metatarsal. Assessment:   1. Hallux limitus of right foot   2. Stress reaction of right foot with routine healing, subsequent encounter     Plan:  Patient was evaluated and treated and all questions answered.  S/p foot surgery right Doing much better and x-rays today do confirm that she did have a stress reaction and early stress fracture on the second metatarsal.  Has healed at this point discussed this is possible to recur but do not think this will be a long-term issue for her.  Follow-up with me as needed for this or other issues.  No follow-ups on file.  "

## 2024-10-29 ENCOUNTER — Encounter: Admitting: Nurse Practitioner
